# Patient Record
Sex: Female | Born: 1985 | Race: White | Hispanic: No | Marital: Single | Smoking: Current every day smoker
Health system: Southern US, Community
[De-identification: ages and names within clinical notes are randomized; demographics above are authoritative.]

## PROBLEM LIST (undated history)

## (undated) DIAGNOSIS — F410 Panic disorder [episodic paroxysmal anxiety] without agoraphobia: Secondary | ICD-10-CM

## (undated) DIAGNOSIS — E119 Type 2 diabetes mellitus without complications: Secondary | ICD-10-CM

## (undated) DIAGNOSIS — F419 Anxiety disorder, unspecified: Secondary | ICD-10-CM

## (undated) HISTORY — PX: TUBAL LIGATION: SHX77

## (undated) HISTORY — PX: OTHER SURGICAL HISTORY: SHX169

---

## 2005-09-12 ENCOUNTER — Emergency Department: Payer: Self-pay | Admitting: Emergency Medicine

## 2005-09-24 ENCOUNTER — Other Ambulatory Visit: Payer: Self-pay

## 2005-09-24 ENCOUNTER — Emergency Department: Payer: Self-pay | Admitting: Emergency Medicine

## 2007-07-15 ENCOUNTER — Emergency Department: Payer: Self-pay | Admitting: Emergency Medicine

## 2007-12-19 ENCOUNTER — Emergency Department: Payer: Self-pay | Admitting: Emergency Medicine

## 2010-04-24 ENCOUNTER — Emergency Department: Payer: Self-pay | Admitting: Emergency Medicine

## 2010-09-13 ENCOUNTER — Emergency Department: Payer: Self-pay | Admitting: Emergency Medicine

## 2011-12-01 ENCOUNTER — Emergency Department: Payer: Self-pay | Admitting: *Deleted

## 2011-12-01 LAB — CBC
HCT: 42.7 % (ref 35.0–47.0)
MCHC: 34 g/dL (ref 32.0–36.0)
MCV: 88 fL (ref 80–100)
Platelet: 248 10*3/uL (ref 150–440)
RBC: 4.88 10*6/uL (ref 3.80–5.20)
RDW: 13.5 % (ref 11.5–14.5)
WBC: 8.3 10*3/uL (ref 3.6–11.0)

## 2011-12-01 LAB — LIPASE, BLOOD: Lipase: 68 U/L — ABNORMAL LOW (ref 73–393)

## 2011-12-01 LAB — COMPREHENSIVE METABOLIC PANEL
Anion Gap: 7 (ref 7–16)
BUN: 10 mg/dL (ref 7–18)
Calcium, Total: 9.1 mg/dL (ref 8.5–10.1)
Chloride: 107 mmol/L (ref 98–107)
Co2: 27 mmol/L (ref 21–32)
EGFR (African American): >60
Osmolality: 280 (ref 275–301)
SGOT(AST): 21 U/L (ref 15–37)
SGPT (ALT): 35 U/L (ref 12–78)
Total Protein: 7.2 g/dL (ref 6.4–8.2)

## 2011-12-01 LAB — URINALYSIS, COMPLETE
Bacteria: NONE SEEN
Bilirubin,UR: NEGATIVE
Blood: NEGATIVE
Glucose,UR: NEGATIVE mg/dL (ref 0–75)
Specific Gravity: 1.019 (ref 1.003–1.030)
Squamous Epithelial: 1

## 2011-12-02 LAB — LITHIUM LEVEL: Lithium: 0.21 mmol/L — ABNORMAL LOW

## 2013-05-01 IMAGING — CT CT ABD-PELV W/ CM
1 of 2 series · 15 of 32 positions shown, 19 images · non-contrast
Comparison: none

REASON FOR EXAM: (1) trauma; (2) trauma
COMMENTS:

PROCEDURE:     CT  - CT ABDOMEN / PELVIS  W  - December 02, 2011 [DATE]
RESULT:     Comparison is made to a prior study dated 12/19/2007.
TECHNIQUE: Helical 3 mm sections were obtained from the lung bases through
the pubic symphysis status post intravenous administration of 100 mL of
Dsovue-O8M.

[Series 2: 3mm soft tissue · axial · 0.93mm/px · z∈[-1024,-595]mm · 15 of 157 slices shown, 19 images]
[im 7/157  soft-tissue]
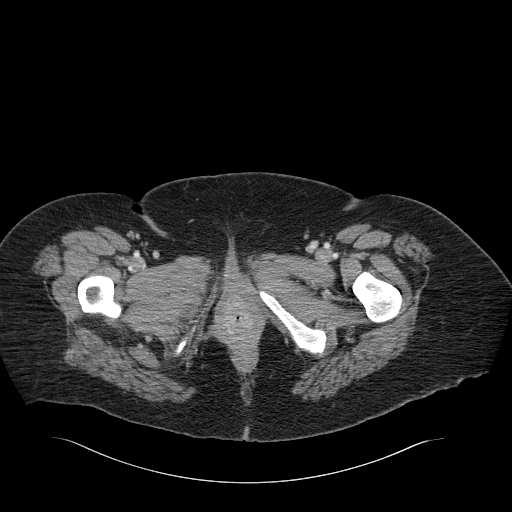
[im 7/157  bone]
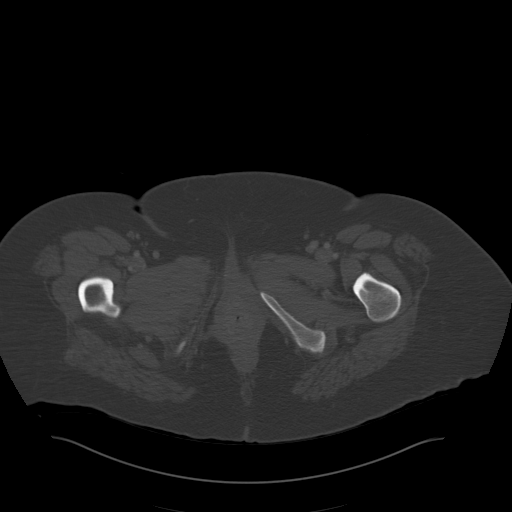
[im 20/157  soft-tissue]
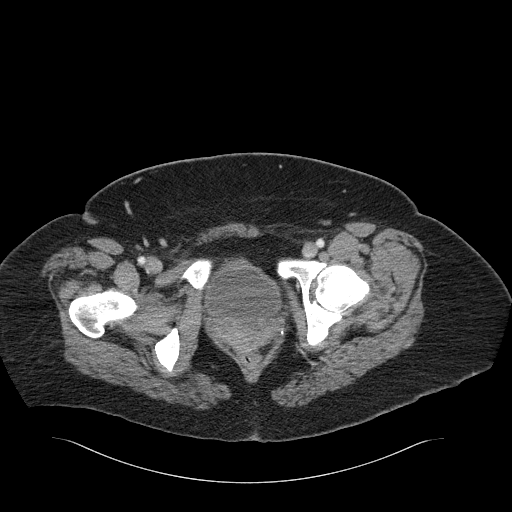
[im 33/157  soft-tissue]
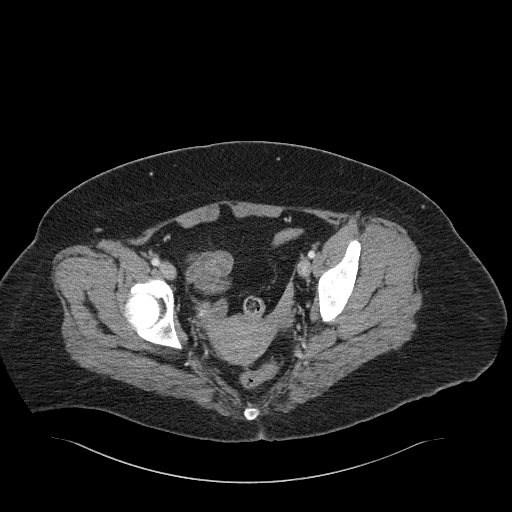
[im 46/157  soft-tissue]
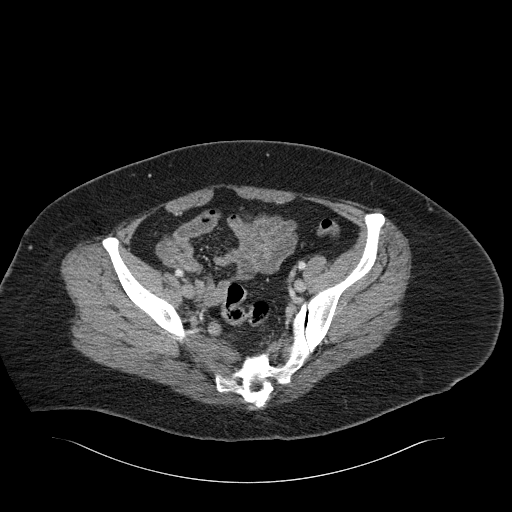
[im 53/157  soft-tissue]
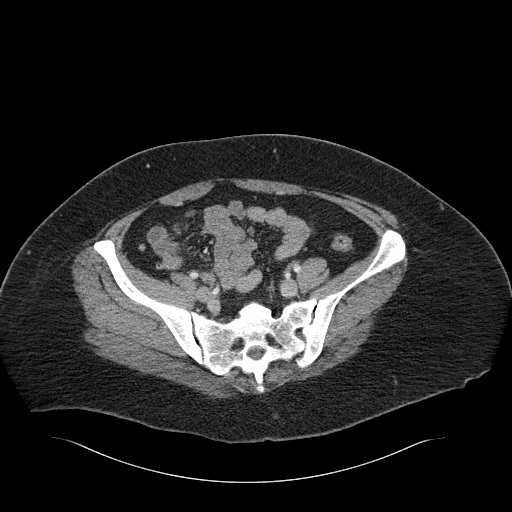
[im 66/157  soft-tissue]
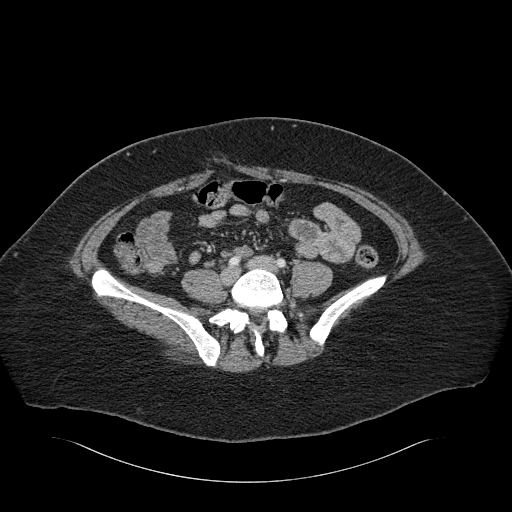
[im 79/157  soft-tissue]
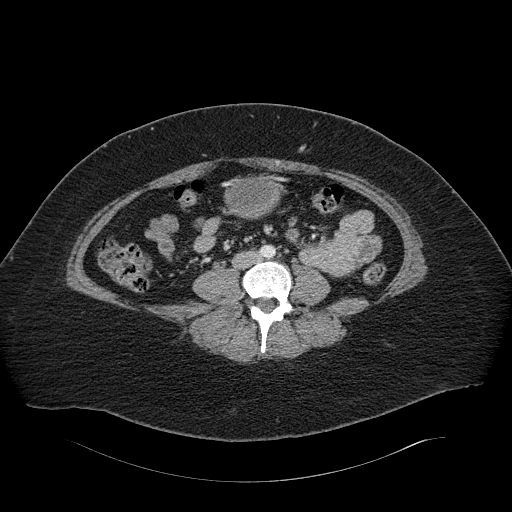
[im 92/157  soft-tissue]
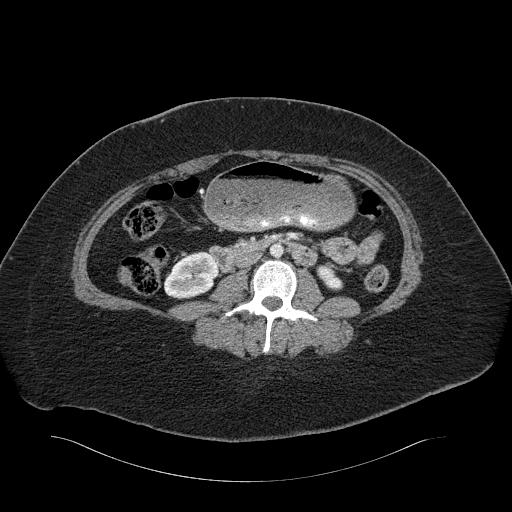
[im 105/157  soft-tissue]
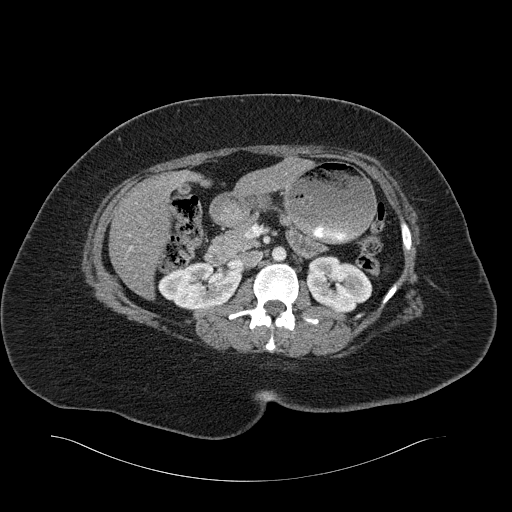
[im 105/157  bone]
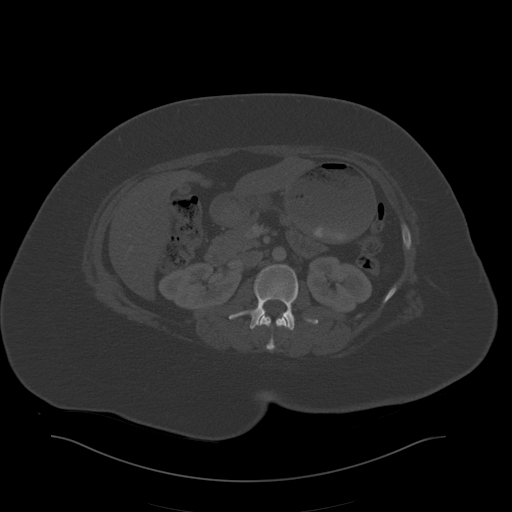
[im 111/157  soft-tissue]
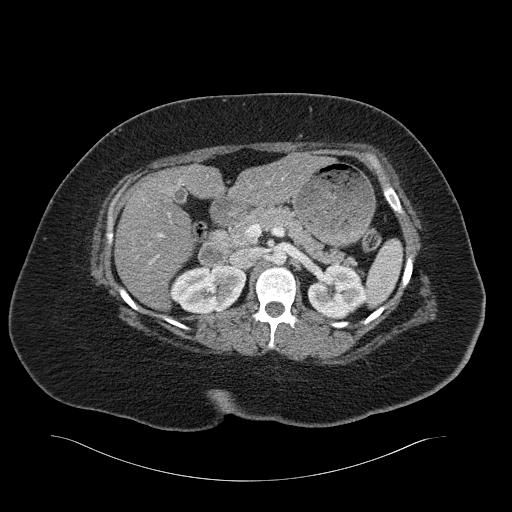
[im 124/157  soft-tissue]
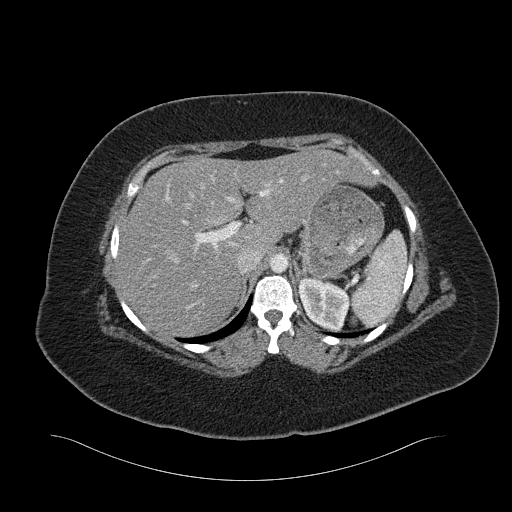
[im 131/157  lung]
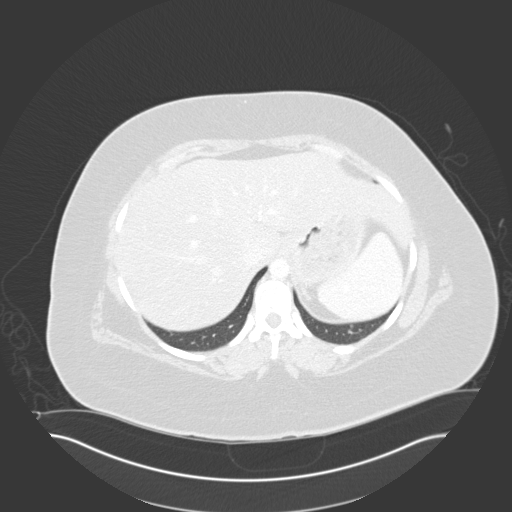
[im 137/157  soft-tissue]
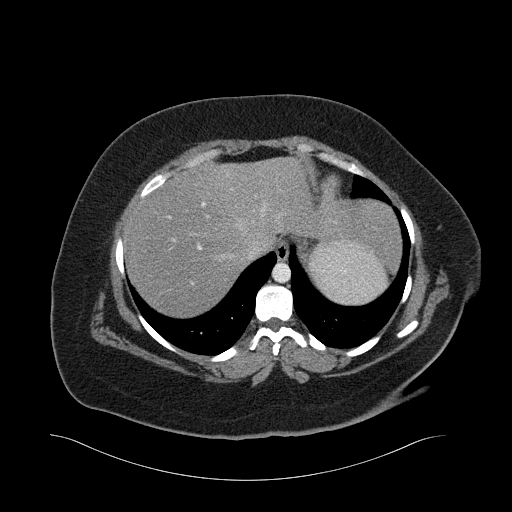
[im 137/157  lung]
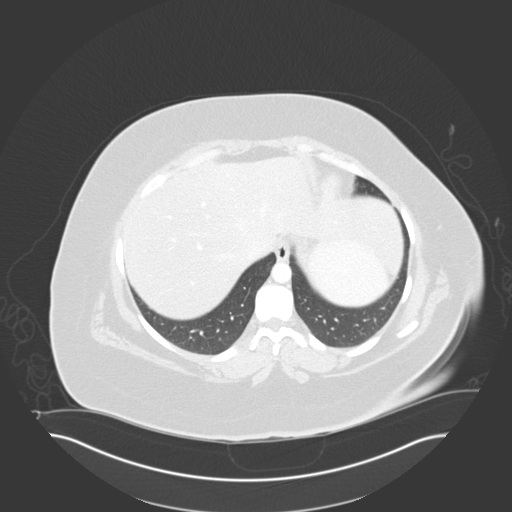
[im 144/157  lung]
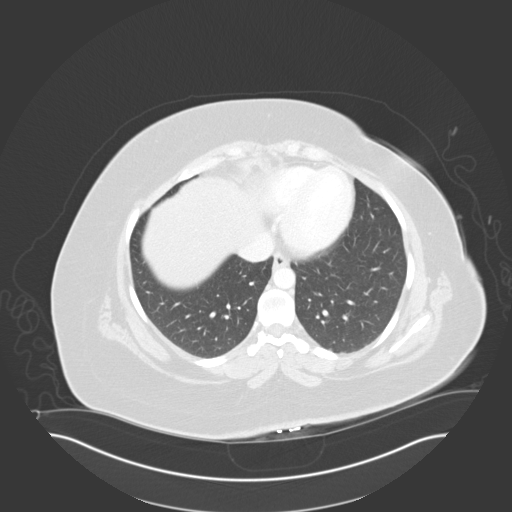
[im 150/157  soft-tissue]
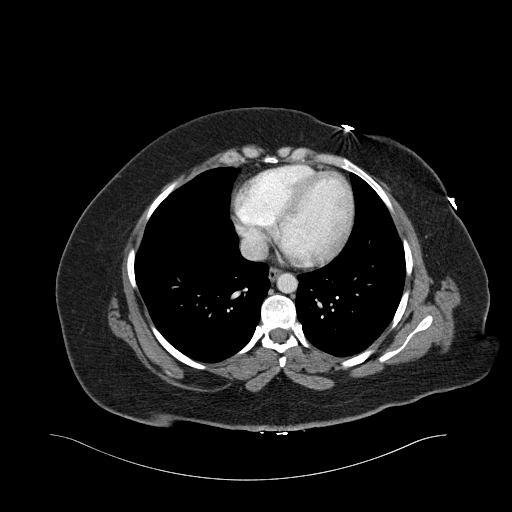
[im 150/157  lung]
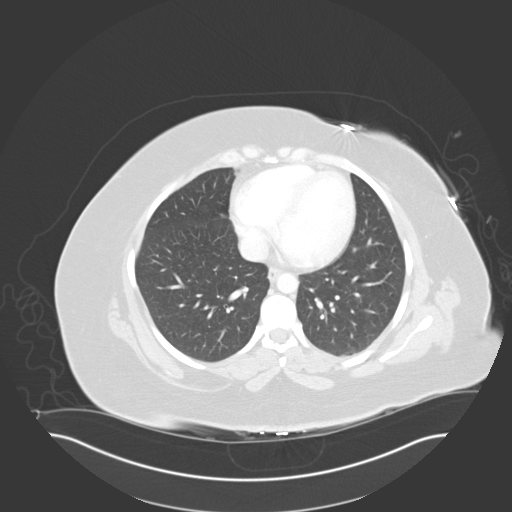

[15 of 32 positions shown; findings below may reference images not displayed]

FINDINGS: There is no evidence of solid or hollow organ injury appreciated.
The liver demonstrates diffuse low-attenuating architecture. Trace amount of
fluid is identified within the deep pelvis possibly physiologic. There is no
evidence of a hematoma. There is no evidence of pneumoperitoneum. Surgical
clips are identified within the right adnexal region also appreciated within
the cul-de-sac. There is no evidence of abdominal aortic aneurysm or
dissection. There is no CT evidence of fracture. There are findings which
may represent a broad-based disc bulge at L5-S1 level with possible mass
effect upon the thecal sac.

The lung bases are unremarkable.
IMPRESSION: 1. No definite acute traumatic injury appreciated within the abdomen or
pelvis.
2. Additional findings as described above.
3. Dr. Jharol of the Emergency Department was informed of these findings via
a preliminary faxed report.

## 2014-02-24 ENCOUNTER — Emergency Department: Payer: Self-pay | Admitting: Emergency Medicine

## 2014-12-15 ENCOUNTER — Encounter: Payer: Self-pay | Admitting: Emergency Medicine

## 2014-12-15 ENCOUNTER — Emergency Department
Admission: EM | Admit: 2014-12-15 | Discharge: 2014-12-15 | Discharge status: Against Medical Advice | Payer: Medicaid Other | Attending: Internal Medicine | Admitting: Internal Medicine

## 2014-12-15 DIAGNOSIS — R1031 Right lower quadrant pain: Secondary | ICD-10-CM | POA: Diagnosis present

## 2014-12-15 DIAGNOSIS — R110 Nausea: Secondary | ICD-10-CM | POA: Insufficient documentation

## 2014-12-15 DIAGNOSIS — Z87891 Personal history of nicotine dependence: Secondary | ICD-10-CM | POA: Insufficient documentation

## 2014-12-15 LAB — URINALYSIS COMPLETE WITH MICROSCOPIC (ARMC ONLY)
Bilirubin Urine: NEGATIVE
GLUCOSE, UA: NEGATIVE mg/dL
NITRITE: NEGATIVE
Protein, ur: 30 mg/dL — AB
SPECIFIC GRAVITY, URINE: 1.031 — AB (ref 1.005–1.030)
pH: 5 (ref 5.0–8.0)

## 2014-12-15 LAB — COMPREHENSIVE METABOLIC PANEL
ALT: 42 U/L (ref 14–54)
AST: 25 U/L (ref 15–41)
Albumin: 3.5 g/dL (ref 3.5–5.0)
Alkaline Phosphatase: 116 U/L (ref 38–126)
Anion gap: 7 (ref 5–15)
BILIRUBIN TOTAL: 0.5 mg/dL (ref 0.3–1.2)
BUN: 11 mg/dL (ref 6–20)
CO2: 25 mmol/L (ref 22–32)
CREATININE: 0.63 mg/dL (ref 0.44–1.00)
Calcium: 9.1 mg/dL (ref 8.9–10.3)
Chloride: 109 mmol/L (ref 101–111)
GFR calc Af Amer: >60 mL/min (ref >60)
Glucose, Bld: 115 mg/dL — ABNORMAL HIGH (ref 65–99)
Potassium: 3 mmol/L — ABNORMAL LOW (ref 3.5–5.1)
Sodium: 141 mmol/L (ref 135–145)
TOTAL PROTEIN: 6.6 g/dL (ref 6.5–8.1)

## 2014-12-15 LAB — CBC
HCT: 43.3 % (ref 35.0–47.0)
Hemoglobin: 14.5 g/dL (ref 12.0–16.0)
MCH: 30.1 pg (ref 26.0–34.0)
MCHC: 33.4 g/dL (ref 32.0–36.0)
MCV: 90.1 fL (ref 80.0–100.0)
PLATELETS: 245 10*3/uL (ref 150–440)
RBC: 4.81 MIL/uL (ref 3.80–5.20)
RDW: 14 % (ref 11.5–14.5)
WBC: 12.1 10*3/uL — AB (ref 3.6–11.0)

## 2014-12-15 LAB — LIPASE, BLOOD: Lipase: 17 U/L — ABNORMAL LOW (ref 22–51)

## 2014-12-15 LAB — ABO/RH: ABO/RH(D): O POS

## 2014-12-15 LAB — HCG, QUANTITATIVE, PREGNANCY: hCG, Beta Chain, Quant, S: <1 m[IU]/mL (ref <5)

## 2014-12-15 NOTE — ED Notes (Addendum)
Pt reports RLQ abd pain that started 3-4 days ago. Denies vomiting or diarrhea. Endorses nausea. Denies fever. Pt also states she is concerned that she has had a miscarriage because she feel off a latter  Tuesday and began bleeding there after. Pt states she does not know how far along she was at the time

## 2014-12-17 ENCOUNTER — Telehealth: Payer: Self-pay | Admitting: Emergency Medicine

## 2014-12-17 NOTE — ED Notes (Signed)
Called patient due to lwot to inquire about condition and follow up plans.  No answer and no voicemail  

## 2015-10-06 ENCOUNTER — Emergency Department
Admission: EM | Admit: 2015-10-06 | Discharge: 2015-10-06 | Disposition: A | Discharge status: Home / Self Care | Payer: Medicaid Other | Attending: Emergency Medicine | Admitting: Emergency Medicine

## 2015-10-06 ENCOUNTER — Encounter: Payer: Self-pay | Admitting: Emergency Medicine

## 2015-10-06 DIAGNOSIS — F17200 Nicotine dependence, unspecified, uncomplicated: Secondary | ICD-10-CM | POA: Insufficient documentation

## 2015-10-06 DIAGNOSIS — K089 Disorder of teeth and supporting structures, unspecified: Secondary | ICD-10-CM | POA: Insufficient documentation

## 2015-10-06 DIAGNOSIS — K122 Cellulitis and abscess of mouth: Secondary | ICD-10-CM | POA: Insufficient documentation

## 2015-10-06 MED ORDER — SULFAMETHOXAZOLE-TRIMETHOPRIM 800-160 MG PO TABS: 1.0000 | ORAL_TABLET | Freq: Two times a day (BID) | ORAL | Status: AC

## 2015-10-06 MED ORDER — PREDNISONE 20 MG PO TABS: ORAL_TABLET | ORAL | Status: AC

## 2015-10-06 NOTE — Discharge Instructions (Signed)

## 2015-10-06 NOTE — ED Provider Notes (Signed)
[facility] Emergency Department Provider Note  ____________________________________________  Time seen: Approximately 4:19 PM  I have reviewed the triage vital signs and the nursing notes.   HISTORY  Chief Complaint Facial Swelling    HPI Sheila Mcknight is a 30 y.o. female , NAD, presents to the emergency part with 2 hour history of right cheek swelling. States she woke approximately 2 hours ago with significant right cheek swelling and a sensation of a "rock" being in her cheek. Denies any exposures to known allergens. Has no environmental allergies. Took Benadryl when she noted the swelling which has not helped. Has not had any difficulty breathing, swallowing nor had any wheezing or shortness of breath. Denies swelling about the tongue, throat, lips. Does know she has poor dentition and is scheduled to have teeth removed over the next 1-2 months. Denies any dental pain. Has not had any fevers, chills, body aches. No abdominal pain, nausea, vomiting.   History reviewed. No pertinent past medical history.  There are no active problems to display for this patient.   Past Surgical History  Procedure Laterality Date  . Tubal ligation      Current Outpatient Rx  Name  Route  Sig  Dispense  Refill  . predniSONE (DELTASONE) 20 MG tablet      Take 2 tablets by mouth, once daily, for 5 days   10 tablet   0   . sulfamethoxazole-trimethoprim (BACTRIM DS,SEPTRA DS) 800-160 MG tablet   Oral   Take 1 tablet by mouth 2 (two) times daily.   14 tablet   0     Allergies Contrast media and Penicillins  No family history on file.  Social History Social History  Substance Use Topics  . Smoking status: Current Every Day Smoker -- 0.50 packs/day  . Smokeless tobacco: Never Used  . Alcohol Use: No     Review of Systems  Constitutional: No fever/chills Head: Swelling about right cheek. Eyes: No visual changes. No discharge ENT: Positive poor  dentition. No dental pain, sore throat, swelling about throat/lips/tongue, difficulty swallowing, dry mouth, ear pain. Cardiovascular: No chest pain. Respiratory: No cough. No shortness of breath. No wheezing.  Gastrointestinal: No abdominal pain.  No nausea, vomiting.   Musculoskeletal: No jaw or neck pain.  Skin: Negative for rash, redness, warmth, open wounds or lacerations. Neurological: Negative for headaches, focal weakness or numbness. No tingling 10-point ROS otherwise negative.  ____________________________________________   PHYSICAL EXAM:  VITAL SIGNS: ED Triage Vitals  Enc Vitals Group     BP 10/06/15 1559 142/91 mmHg     Pulse Rate 10/06/15 1559 88     Resp 10/06/15 1559 20     Temp 10/06/15 1559 98.8 F (37.1 C)     Temp Source 10/06/15 1559 Oral     SpO2 10/06/15 1559 97 %     Weight 10/06/15 1559 260 lb (117.935 kg)     Height 10/06/15 1559 5\' 7"  (1.702 m)     Head Cir --      Peak Flow --      Pain Score --      Pain Loc --      Pain Edu? --      Excl. in GC? --      Constitutional: Alert and oriented. Well appearing and in no acute distress. Eyes: Conjunctivae are normal.  Head: Atraumatic. ENT:      Ears: TMs visualized bilaterally without erythema, effusion, bulging, perforation.  Nose: No congestion/rhinnorhea.      Mouth/Throat: Poor dentition with multiple cavities, broken teeth and discoloration throughout the mouth. No tenderness to palpation about the right upper or lower gumline but mild erythema and swelling is noted about the posterior gumline. Mucous membranes are moist. Uvula is midline. Pharynx without erythema, swelling, exudate Neck: No stridor. Supple with full range of motion. Hematological/Lymphatic/Immunilogical: No cervical lymphadenopathy. Cardiovascular:  Good peripheral circulation. Respiratory: Normal respiratory effort without tachypnea or retractions.  Musculoskeletal: No tenderness to palpation about bilateral TMJ. No  tenderness to palpation about bilateral mandibles. Neurologic:  Normal speech and language. No gross focal neurologic deficits are appreciated. Gait and posture are normal Skin:  Skin is warm, dry and intact. No rash, redness, open wounds, skin sores noted. Psychiatric: Mood and affect are normal. Speech and behavior are normal. Patient exhibits appropriate insight and judgement.   ____________________________________________   LABS  None ____________________________________________  EKG  None ____________________________________________  RADIOLOGY  None ____________________________________________    PROCEDURES  Procedure(s) performed: None    Medications - No data to display   ____________________________________________   INITIAL IMPRESSION / ASSESSMENT AND PLAN / ED COURSE  Patient's diagnosis is consistent with cellulitis of oral soft tissues due to poor dentition. Patient will be discharged home with prescriptions for prednisone and Bactrim DS to take as directed. Patient may take Tylenol as needed for any pain. Patient is to follow up with [facility] if symptoms persist past this treatment course. Patient also advised to continue dental care plan as she has outlined with her dentist over the next 1-2 months. Patient is given ED precautions to return to the ED for any worsening or new symptoms.     ____________________________________________  FINAL CLINICAL IMPRESSION(S) / ED DIAGNOSES  Final diagnoses:  Cellulitis of oral soft tissues  Poor dentition      NEW MEDICATIONS STARTED DURING THIS VISIT:  New Prescriptions   PREDNISONE (DELTASONE) 20 MG TABLET    Take 2 tablets by mouth, once daily, for 5 days   SULFAMETHOXAZOLE-TRIMETHOPRIM (BACTRIM DS,SEPTRA DS) 800-160 MG TABLET    Take 1 tablet by mouth 2 (two) times daily.         Hope Pigeon, PA-C 10/06/15 1629  Emily Filbert, MD 10/06/15 [phone]

## 2015-10-06 NOTE — ED Notes (Signed)
Pt comes into the ED via POV c/o swelling to the right cheek.  Patient states she woke up from a nap earlier today with it swollen.  H/o of dental abscesses, denies any allergic reaction to any known substances she has come in contact with, denies any pain, denies difficulty swallowing and breathing.  Patient in NAD at this time with even and unlabored respirations.

## 2017-07-19 ENCOUNTER — Encounter: Payer: Self-pay | Admitting: Emergency Medicine

## 2017-07-19 ENCOUNTER — Other Ambulatory Visit: Payer: Self-pay

## 2017-07-19 DIAGNOSIS — Z79899 Other long term (current) drug therapy: Secondary | ICD-10-CM | POA: Diagnosis not present

## 2017-07-19 DIAGNOSIS — F17210 Nicotine dependence, cigarettes, uncomplicated: Secondary | ICD-10-CM | POA: Diagnosis not present

## 2017-07-19 DIAGNOSIS — O9989 Other specified diseases and conditions complicating pregnancy, childbirth and the puerperium: Secondary | ICD-10-CM | POA: Insufficient documentation

## 2017-07-19 DIAGNOSIS — Z3A22 22 weeks gestation of pregnancy: Secondary | ICD-10-CM | POA: Insufficient documentation

## 2017-07-19 DIAGNOSIS — Z3201 Encounter for pregnancy test, result positive: Secondary | ICD-10-CM | POA: Insufficient documentation

## 2017-07-19 DIAGNOSIS — R55 Syncope and collapse: Secondary | ICD-10-CM | POA: Diagnosis not present

## 2017-07-19 DIAGNOSIS — O99332 Smoking (tobacco) complicating pregnancy, second trimester: Secondary | ICD-10-CM | POA: Insufficient documentation

## 2017-07-19 LAB — CBC
HCT: 38.8 % (ref 35.0–47.0)
HEMOGLOBIN: 13.3 g/dL (ref 12.0–16.0)
MCH: 29.5 pg (ref 26.0–34.0)
MCHC: 34.3 g/dL (ref 32.0–36.0)
MCV: 86.2 fL (ref 80.0–100.0)
PLATELETS: 307 10*3/uL (ref 150–440)
RBC: 4.5 MIL/uL (ref 3.80–5.20)
RDW: 13.7 % (ref 11.5–14.5)
WBC: 14.3 10*3/uL — AB (ref 3.6–11.0)

## 2017-07-19 LAB — URINALYSIS, COMPLETE (UACMP) WITH MICROSCOPIC
BILIRUBIN URINE: NEGATIVE
GLUCOSE, UA: NEGATIVE mg/dL
HGB URINE DIPSTICK: NEGATIVE
KETONES UR: NEGATIVE mg/dL
LEUKOCYTES UA: NEGATIVE
NITRITE: NEGATIVE
PROTEIN: NEGATIVE mg/dL
Specific Gravity, Urine: 1.005 (ref 1.005–1.030)
pH: 6 (ref 5.0–8.0)

## 2017-07-19 LAB — BASIC METABOLIC PANEL
ANION GAP: 9 (ref 5–15)
BUN: 7 mg/dL (ref 6–20)
CALCIUM: 9.1 mg/dL (ref 8.9–10.3)
CO2: 22 mmol/L (ref 22–32)
CREATININE: 0.52 mg/dL (ref 0.44–1.00)
Chloride: 102 mmol/L (ref 101–111)
Glucose, Bld: 157 mg/dL — ABNORMAL HIGH (ref 65–99)
Potassium: 3.3 mmol/L — ABNORMAL LOW (ref 3.5–5.1)
SODIUM: 133 mmol/L — AB (ref 135–145)

## 2017-07-19 NOTE — ED Triage Notes (Signed)
Pt arrived to the ED accompanied by a friend for complaints of "passing out." Pt states that she was walking to her living room when she passed out, Pt denies hitting her head but it was an unwitnessed fall. Pt reports that this has happened before when she has been pregnant in the past. Pt reports that she can not be pregnant because she got a tubal ligation. Pt is AOx4 in no apparent distress with no neurological deficiencies and denies any other symptoms.

## 2017-07-20 ENCOUNTER — Emergency Department
Admission: EM | Admit: 2017-07-20 | Discharge: 2017-07-20 | Disposition: A | Discharge status: Home / Self Care | Payer: Medicaid Other | Attending: Emergency Medicine | Admitting: Emergency Medicine

## 2017-07-20 ENCOUNTER — Emergency Department: Payer: Medicaid Other

## 2017-07-20 ENCOUNTER — Encounter: Payer: Self-pay | Admitting: Radiology

## 2017-07-20 DIAGNOSIS — Z3A22 22 weeks gestation of pregnancy: Secondary | ICD-10-CM

## 2017-07-20 DIAGNOSIS — R55 Syncope and collapse: Secondary | ICD-10-CM

## 2017-07-20 LAB — PREGNANCY, URINE: PREG TEST UR: POSITIVE — AB

## 2017-07-20 LAB — TROPONIN I: Troponin I: <0.03 ng/mL (ref <0.03)

## 2017-07-20 LAB — HCG, QUANTITATIVE, PREGNANCY: HCG, BETA CHAIN, QUANT, S: 15751 m[IU]/mL — AB (ref <5)

## 2017-07-20 NOTE — ED Provider Notes (Signed)
[facility] Emergency Department Provider Note   ____________________________________________   First MD Initiated Contact with Patient 07/20/17 0019     (approximate)  I have reviewed the triage vital signs and the nursing notes.   HISTORY  Chief Complaint Loss of Consciousness    HPI Sheila Mcknight is a 32 y.o. female who comes into the hospital today with a syncopal episode.  The patient states that around 7 PM she passed out.  She reports that she had been feeling completely fine earlier today and was just walking through her living room when she went down.  The patient had no symptoms prior to her passing out.  She denies any tunnel vision any chest pains any palpitations or anything else.  The patient states that she has passed out before but it was only when she was pregnant.  The patient has been eating and drinking okay.  She has had her tubes tied in the past.  The patient denies any complaints and has been urinating well without any pain.  The patient is here today for evaluation.   History reviewed. No pertinent past medical history.  There are no active problems to display for this patient.   Past Surgical History:  Procedure Laterality Date  . TUBAL LIGATION      Prior to Admission medications   Medication Sig Start Date End Date Taking? Authorizing Provider  predniSONE (DELTASONE) 20 MG tablet Take 2 tablets by mouth, once daily, for 5 days 10/06/15   Hagler, Jami L, PA-C  sulfamethoxazole-trimethoprim (BACTRIM DS,SEPTRA DS) 800-160 MG tablet Take 1 tablet by mouth 2 (two) times daily. 10/06/15   Hagler, Jami L, PA-C    Allergies Contrast media [iodinated diagnostic agents] and Penicillins  History reviewed. No pertinent family history.  Social History Social History   Tobacco Use  . Smoking status: Current Every Day Smoker    Packs/day: 0.50  . Smokeless tobacco: Never Used  Substance Use Topics  . Alcohol use: No  . Drug  use: No    Review of Systems  Constitutional: No fever/chills Eyes: No visual changes. ENT: No sore throat. Cardiovascular: Denies chest pain. Respiratory: Denies shortness of breath. Gastrointestinal: No abdominal pain.  No nausea, no vomiting.  No diarrhea.  No constipation. Genitourinary: Negative for dysuria. Musculoskeletal: Negative for back pain. Skin: Negative for rash. Neurological: Syncope   ____________________________________________   PHYSICAL EXAM:  VITAL SIGNS: ED Triage Vitals  Enc Vitals Group     BP 07/19/17 2051 (!) 148/89     Pulse Rate 07/19/17 2051 (!) 123     Resp 07/19/17 2051 18     Temp 07/19/17 2051 98.8 F (37.1 C)     Temp Source 07/19/17 2051 Oral     SpO2 07/19/17 2051 98 %     Weight 07/19/17 2052 280 lb (127 kg)     Height 07/19/17 2052 5\' 7"  (1.702 m)     Head Circumference --      Peak Flow --      Pain Score 07/19/17 2102 0     Pain Loc --      Pain Edu? --      Excl. in GC? --     Constitutional: Alert and oriented. Well appearing and in no acute distress. Eyes: Conjunctivae are normal. PERRL. EOMI. Head: Atraumatic. Nose: No congestion/rhinnorhea. Mouth/Throat: Mucous membranes are moist.  Oropharynx non-erythematous. Cardiovascular: Normal rate, regular rhythm. Grossly normal heart sounds.  Good peripheral circulation. Respiratory: Normal  respiratory effort.  No retractions. Lungs CTAB. Gastrointestinal: Soft and nontender. No distention.  Positive bowel sounds Musculoskeletal: No lower extremity tenderness nor edema.  Neurologic:  Normal speech and language.  Cranial nerves II through XII are grossly intact with no focal motor neuro deficit Skin:  Skin is warm, dry and intact.  Psychiatric: Mood and affect are normal.   ____________________________________________   LABS (all labs ordered are listed, but only abnormal results are displayed)  Labs Reviewed  BASIC METABOLIC PANEL - Abnormal; Notable for the following  components:      Result Value   Sodium 133 (*)    Potassium 3.3 (*)    Glucose, Bld 157 (*)    All other components within normal limits  CBC - Abnormal; Notable for the following components:   WBC 14.3 (*)    All other components within normal limits  URINALYSIS, COMPLETE (UACMP) WITH MICROSCOPIC - Abnormal; Notable for the following components:   Color, Urine YELLOW (*)    APPearance CLEAR (*)    Bacteria, UA MANY (*)    Squamous Epithelial / LPF 0-5 (*)    All other components within normal limits  PREGNANCY, URINE - Abnormal; Notable for the following components:   Preg Test, Ur POSITIVE (*)    All other components within normal limits  HCG, QUANTITATIVE, PREGNANCY - Abnormal; Notable for the following components:   hCG, Beta Chain, Quant, S 15,751 (*)    All other components within normal limits  URINE CULTURE  TROPONIN I  CBG MONITORING, ED  POC URINE PREG, ED   ____________________________________________  EKG  ED ECG REPORT I, Rebecka Apley, the attending physician, personally viewed and interpreted this ECG.   Date: 07/19/2017  EKG Time: 2100  Rate: 123  Rhythm: sinus tachycardia  Axis: normal  Intervals:none  ST&T Change: none  ____________________________________________  RADIOLOGY  ED MD interpretation:  US Ob: Viable 22-week 4-day gestation with subjectively normal amniotic fluid level.  Official radiology report(s): US Ob Limited  Result Date: 07/20/2017 CLINICAL DATA:  Positive pregnancy test with history of tubal ligation. Syncope. EXAM: LIMITED OBSTETRIC ULTRASOUND FINDINGS: Number of Fetuses: 1 Heart Rate:  160 bpm Movement: Yes Presentation: Cephalic Placental Location: Anterior Previa: No Amniotic Fluid (Subjective):  Within normal limits. BPD: 5.4  cm 22 w  4 d MATERNAL FINDINGS: Cervix:  Not visualized Uterus/Adnexae: Right ovary was unremarkable. The left ovary was not visualized. IMPRESSION: Viable 22 week 4 day gestation with  subjectively normal amniotic fluid volume. No placental abruption or previa. This exam is performed on an emergent basis and does not comprehensively evaluate fetal size, dating, or anatomy; follow-up complete OB US should be considered if further fetal assessment is warranted. Electronically Signed   By: Tollie Eth M.D.   On: 07/20/2017 04:04    ____________________________________________   PROCEDURES  Procedure(s) performed: None  Procedures  Critical Care performed: No  ____________________________________________   INITIAL IMPRESSION / ASSESSMENT AND PLAN / ED COURSE  As part of my medical decision making, I reviewed the following data within the electronic MEDICAL RECORD NUMBER Notes from prior ED visits and [place] Controlled Substance Database   This is a 32 year old female who comes into the hospital today with a syncopal episode.  My differential diagnosis includes dehydration, orthostatic hypotension, cardiogenic cause of syncope, neurogenic cause of syncope, infection.  The patient is neurologically intact.  We did check some blood work on the patient to include a CBC CMP urinalysis pregnancy and a  troponin.  The patient CBC shows a white count of 14.3 but her BMP is unremarkable.  The patient's urine shows many bacteria.  At this time we are awaiting the results of the troponin and the urine pregnancy.  I will have the patient orally hydrate as she states she does not want to be stuck again and she will be reassessed.  We did obtain the results of the patient's urinalysis and her pregnancy test was positive.  At this time we are concerned about a possible ectopic pregnancy given the patient's history of tubal ligation.  The patient was sent for an ultrasound to evaluate for ectopic pregnancy and was found to be 22 weeks and 4 days pregnant.  The fetal heart tones are 160. The patient will be discharged to home to follow-up with OB/GYN.       ____________________________________________   FINAL CLINICAL IMPRESSION(S) / ED DIAGNOSES  Final diagnoses:  Syncope, unspecified syncope type  [redacted] weeks gestation of pregnancy     ED Discharge Orders    None       Note:  This document was prepared using Dragon voice recognition software and may include unintentional dictation errors.    Rebecka ApleyWebster, Aditya Nastasi P, MD 07/20/17 [phone]

## 2017-07-20 NOTE — Discharge Instructions (Addendum)
Please follow up with OB/GYN for further evaluation of your pregnancy

## 2017-07-20 NOTE — ED Notes (Signed)
Patient transported to Ultrasound 

## 2017-07-21 LAB — URINE CULTURE

## 2019-10-29 ENCOUNTER — Encounter: Payer: Self-pay | Admitting: Emergency Medicine

## 2019-10-29 ENCOUNTER — Other Ambulatory Visit: Payer: Self-pay

## 2019-10-29 ENCOUNTER — Emergency Department
Admission: EM | Admit: 2019-10-29 | Discharge: 2019-10-30 | Discharge status: Against Medical Advice | Payer: Medicaid Other | Attending: Emergency Medicine | Admitting: Emergency Medicine

## 2019-10-29 DIAGNOSIS — Z3200 Encounter for pregnancy test, result unknown: Secondary | ICD-10-CM | POA: Insufficient documentation

## 2019-10-29 DIAGNOSIS — R55 Syncope and collapse: Secondary | ICD-10-CM

## 2019-10-29 LAB — URINALYSIS, COMPLETE (UACMP) WITH MICROSCOPIC
Bilirubin Urine: NEGATIVE
Glucose, UA: >=500 mg/dL — AB
Hgb urine dipstick: NEGATIVE
Ketones, ur: 5 mg/dL — AB
Leukocytes,Ua: NEGATIVE
Nitrite: NEGATIVE
Protein, ur: 100 mg/dL — AB
Specific Gravity, Urine: 1.041 — ABNORMAL HIGH (ref 1.005–1.030)
pH: 5 (ref 5.0–8.0)

## 2019-10-29 LAB — BASIC METABOLIC PANEL
Anion gap: 13 (ref 5–15)
BUN: 6 mg/dL (ref 6–20)
CO2: 27 mmol/L (ref 22–32)
Calcium: 8.9 mg/dL (ref 8.9–10.3)
Chloride: 98 mmol/L (ref 98–111)
Creatinine, Ser: 0.6 mg/dL (ref 0.44–1.00)
GFR calc Af Amer: >60 mL/min (ref >60)
GFR calc non Af Amer: >60 mL/min (ref >60)
Glucose, Bld: 335 mg/dL — ABNORMAL HIGH (ref 70–99)
Potassium: 3 mmol/L — ABNORMAL LOW (ref 3.5–5.1)
Sodium: 138 mmol/L (ref 135–145)

## 2019-10-29 LAB — CBC
HCT: 48.1 % — ABNORMAL HIGH (ref 36.0–46.0)
Hemoglobin: 16.5 g/dL — ABNORMAL HIGH (ref 12.0–15.0)
MCH: 30.6 pg (ref 26.0–34.0)
MCHC: 34.3 g/dL (ref 30.0–36.0)
MCV: 89.2 fL (ref 80.0–100.0)
Platelets: 250 10*3/uL (ref 150–400)
RBC: 5.39 MIL/uL — ABNORMAL HIGH (ref 3.87–5.11)
RDW: 12.7 % (ref 11.5–15.5)
WBC: 10.3 10*3/uL (ref 4.0–10.5)
nRBC: 0 % (ref 0.0–0.2)

## 2019-10-29 LAB — HCG, QUANTITATIVE, PREGNANCY: hCG, Beta Chain, Quant, S: 1 m[IU]/mL (ref <5)

## 2019-10-29 LAB — POCT PREGNANCY, URINE: Preg Test, Ur: NEGATIVE

## 2019-10-29 NOTE — ED Provider Notes (Signed)
Went to evaluate patient to relay results of her hCG test. Patient not currently in room. Nurse reports she stepped outside, will wait to see if she returns   Sharyn Creamer, MD 10/29/19 2353

## 2019-10-29 NOTE — ED Provider Notes (Signed)
[facility] Emergency Department Provider Note   ____________________________________________    I have reviewed the triage vital signs and the nursing notes.   HISTORY  Chief Complaint pregnant and Loss of Consciousness     HPI Sheila Mcknight is a 34 y.o. female who presents after near syncopal episode.  Patient reports that around 1 PM today she had been standing for a few minutes and became dizzy and apparently had a syncopal episode.  She reports she was only down for less than 1 minute.  She denies palpitations or chest pain.  No nausea or vomiting.  No headache or neuro deficits.  She also reports that today she checked a pregnancy test and it was positive which is very alarming to her because she has had a tubal ligation x2.  This may have played a role in her near syncopal episode.  History reviewed. No pertinent past medical history.  There are no problems to display for this patient.   Past Surgical History:  Procedure Laterality Date  . removal of fallopian tubes    . TUBAL LIGATION      Prior to Admission medications   Medication Sig Start Date End Date Taking? Authorizing Provider  predniSONE (DELTASONE) 20 MG tablet Take 2 tablets by mouth, once daily, for 5 days 10/06/15   Hagler, Jami L, PA-C  sulfamethoxazole-trimethoprim (BACTRIM DS,SEPTRA DS) 800-160 MG tablet Take 1 tablet by mouth 2 (two) times daily. 10/06/15   Hagler, Jami L, PA-C     Allergies Contrast media [iodinated diagnostic agents], Penicillins, and Latex  History reviewed. No pertinent family history.  Social History Social History   Tobacco Use  . Smoking status: Current Every Day Smoker    Packs/day: 0.50  . Smokeless tobacco: Never Used  Substance Use Topics  . Alcohol use: No  . Drug use: No    Review of Systems  Constitutional: No fever/chills Eyes: No visual changes.  ENT: No sore throat. Cardiovascular: Denies chest pain. Respiratory: Denies  shortness of breath. Gastrointestinal: No abdominal pain. Genitourinary: Negative for dysuria. Musculoskeletal: Negative for back pain. Skin: Negative for rash. Neurological: Negative for headaches or weakness   ____________________________________________   PHYSICAL EXAM:  VITAL SIGNS: ED Triage Vitals [10/29/19 1526]  Enc Vitals Group     BP (!) 147/108     Pulse Rate 100     Resp 20     Temp 98.8 F (37.1 C)     Temp Source Oral     SpO2 96 %     Weight      Height      Head Circumference      Peak Flow      Pain Score      Pain Loc      Pain Edu?      Excl. in GC?     Constitutional: Alert and oriented.   Nose: No congestion/rhinnorhea. Mouth/Throat: Mucous membranes are moist.   Neck:  Painless ROM Cardiovascular: Normal rate, regular rhythm.  Good peripheral circulation. Respiratory: Normal respiratory effort.  No retractions.  Musculoskeletal: No lower extremity tenderness nor edema.  Warm and well perfused Neurologic:  Normal speech and language. No gross focal neurologic deficits are appreciated.  Skin:  Skin is warm, dry and intact. No rash noted. Psychiatric: Mood and affect are normal. Speech and behavior are normal.  ____________________________________________   LABS (all labs ordered are listed, but only abnormal results are displayed)  Labs Reviewed  BASIC METABOLIC  PANEL - Abnormal; Notable for the following components:      Result Value   Potassium 3.0 (*)    Glucose, Bld 335 (*)    All other components within normal limits  CBC - Abnormal; Notable for the following components:   RBC 5.39 (*)    Hemoglobin 16.5 (*)    HCT 48.1 (*)    All other components within normal limits  URINALYSIS, COMPLETE (UACMP) WITH MICROSCOPIC - Abnormal; Notable for the following components:   Color, Urine AMBER (*)    APPearance CLOUDY (*)    Specific Gravity, Urine 1.041 (*)    Glucose, UA >=500 (*)    Ketones, ur 5 (*)    Protein, ur 100 (*)     Bacteria, UA RARE (*)    All other components within normal limits  HCG, QUANTITATIVE, PREGNANCY  POC URINE PREG, ED  POCT PREGNANCY, URINE   ____________________________________________  EKG  ED ECG REPORT I, Jene Every, the attending physician, personally viewed and interpreted this ECG.  Date: 10/29/2019  Rhythm: normal sinus rhythm QRS Axis: normal Intervals: normal ST/T Wave abnormalities: normal Narrative Interpretation: no evidence of acute ischemia  ____________________________________________  RADIOLOGY  None ____________________________________________   PROCEDURES  Procedure(s) performed: No  Procedures   Critical Care performed: No ____________________________________________   INITIAL IMPRESSION / ASSESSMENT AND PLAN / ED COURSE  Pertinent labs & imaging results that were available during my care of the patient were reviewed by me and considered in my medical decision making (see chart for details).  Patient presents after syncopal episode as described above.  Differential includes vasovagal syncope, dehydration, less likely arrhythmia  EKG is quite reassuring, lab work today is unremarkable, mild elevations of glucose but normal BUN to creatinine ratio.  Patient is quite concerned about possible pregnancy, explained that POCT pregnancy here is negative, she has requested blood test.  We will send hCG.  Once hCG is resulted anticipate discharge with outpatient follow-up with PCP.    ____________________________________________   FINAL CLINICAL IMPRESSION(S) / ED DIAGNOSES  Final diagnoses:  Syncope and collapse        Note:  This document was prepared using Dragon voice recognition software and may include unintentional dictation errors.   Jene Every, MD 10/29/19 [phone]

## 2019-10-29 NOTE — ED Triage Notes (Signed)
Pt here for syncope this AM. Reports was standing still and passed out hitting floor.  Also did home pregnancy test and was positive per pt despite tubes being removed.  Ambulatory, NAD, VSS. Alert and oriented.

## 2019-10-29 NOTE — ED Notes (Signed)
Patient to stat desk inquiring about wait time.  Explained to patient that I had called her for a treatment room earlier but we were unable to locate her.  Patient verbalized understanding.

## 2019-10-29 NOTE — ED Notes (Signed)
Pt went outside 

## 2019-10-29 NOTE — ED Notes (Signed)
Pt states she is pregnant, unknown lmp. Pt states she had a syncopal episode this am. Pt states she knows she is pregnant because she faints when pregnant. Pt states has had nausea, vomiting and diarrhea. Pt appears in no acute distress. Skin PWD.

## 2019-10-30 ENCOUNTER — Telehealth: Payer: Self-pay | Admitting: Emergency Medicine

## 2019-10-30 NOTE — Telephone Encounter (Signed)
Called left voicemail on her line (without patient demographic data) that she may call back to ER if she would like to discuss her visit or answer any questions. - patient evidently eloped

## 2019-10-30 NOTE — ED Notes (Signed)
Pt never returned from outside.

## 2020-01-16 IMAGING — US US OB LIMITED
1 series · 14 of 23 positions shown · non-contrast
Comparison: none

CLINICAL DATA: Positive pregnancy test with history of tubal
ligation. Syncope.

EXAM:
LIMITED OBSTETRIC ULTRASOUND

[Series 1: us ob limited · 0.28mm/px · 14 of 23 slices shown]
[im 1/23]
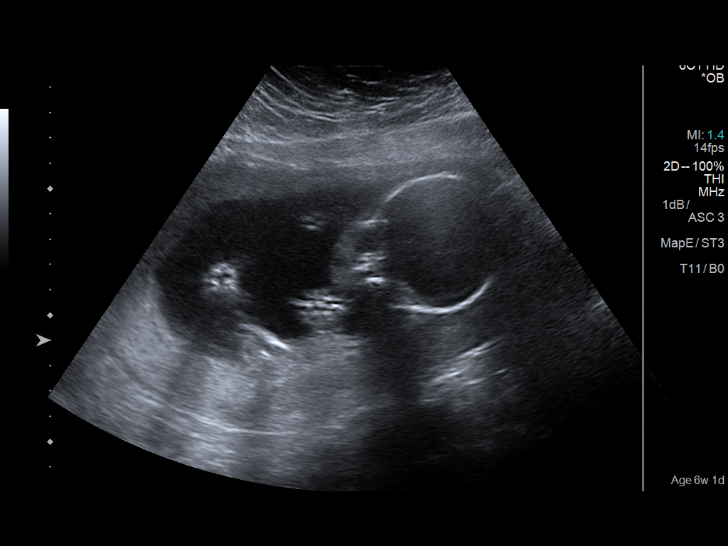
[im 3/23]
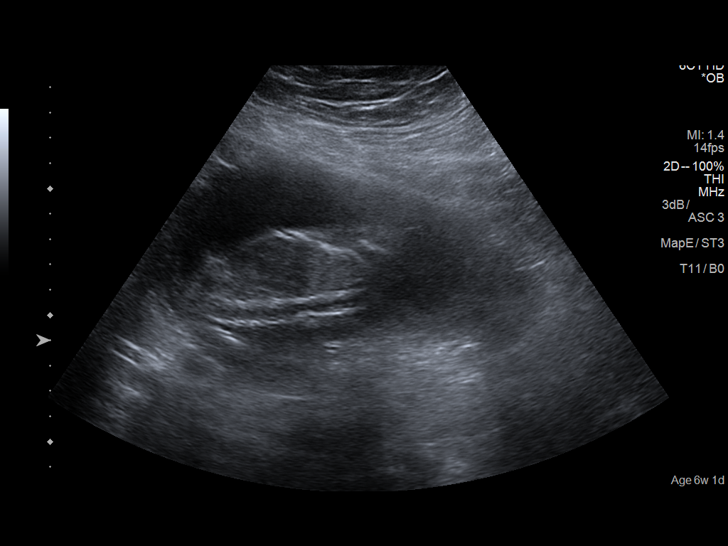
[im 5/23]
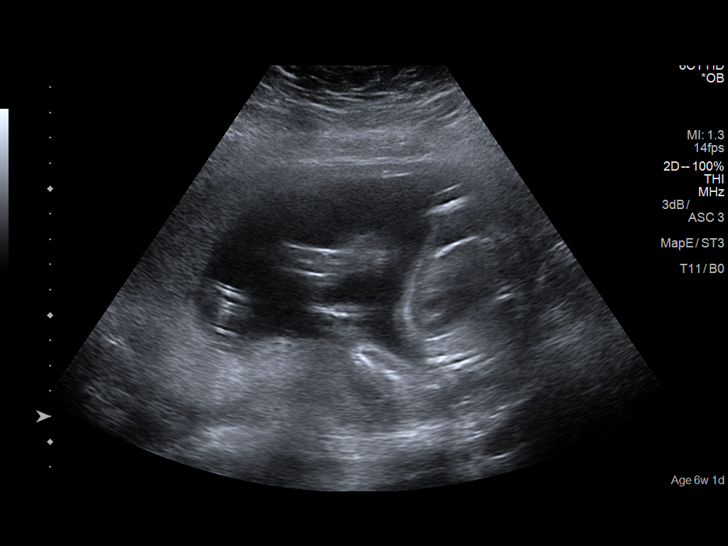
[im 6/23]
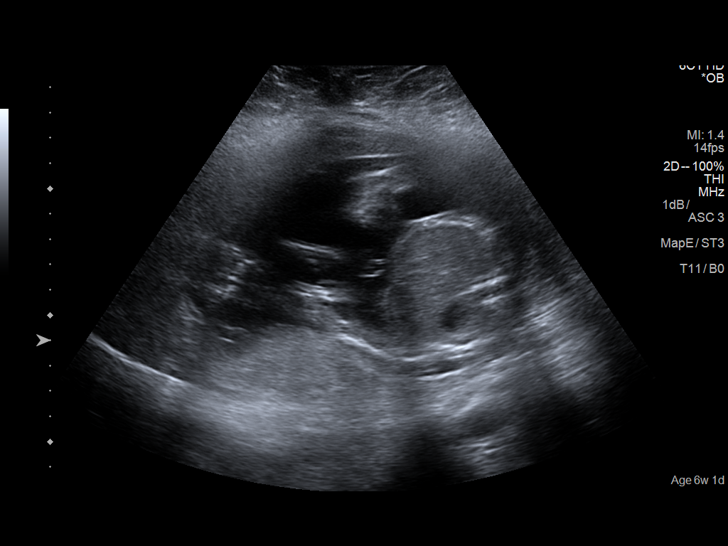
[im 8/23]
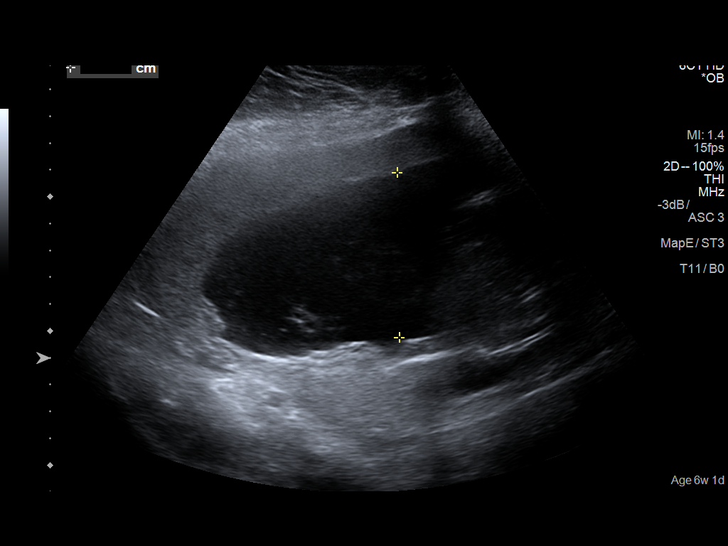
[im 10/23]
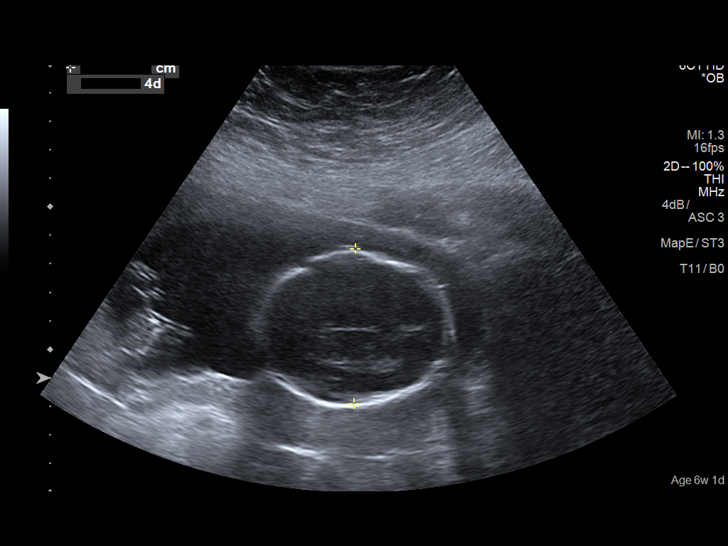
[im 11/23]
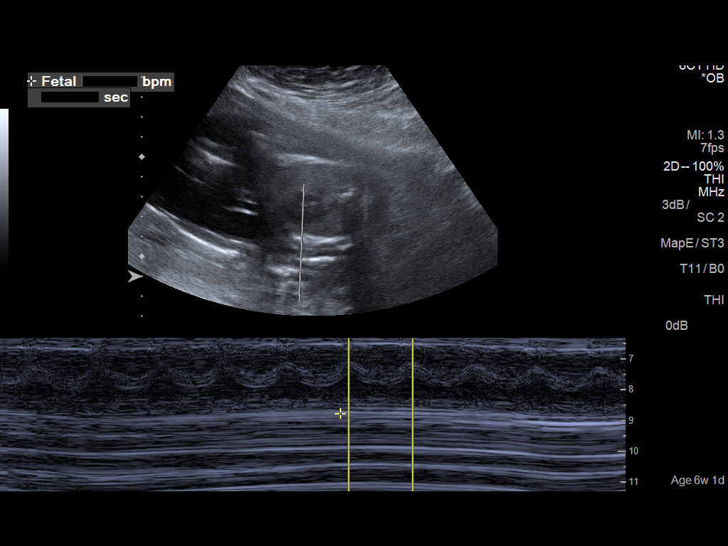
[im 13/23]
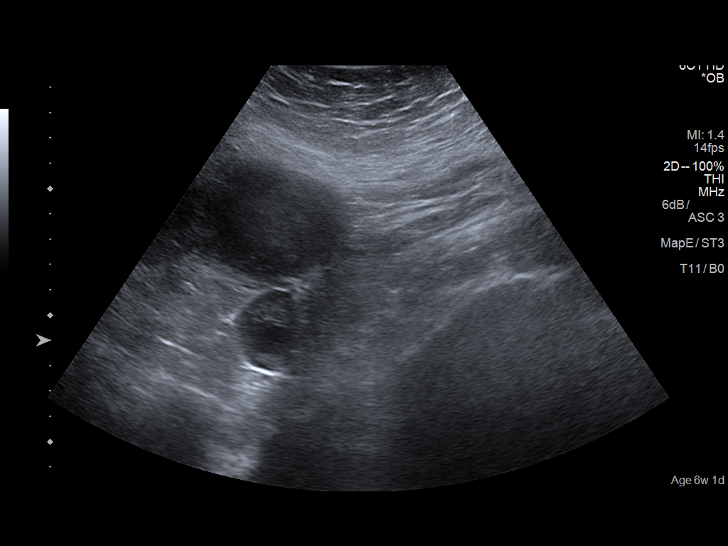
[im 14/23]
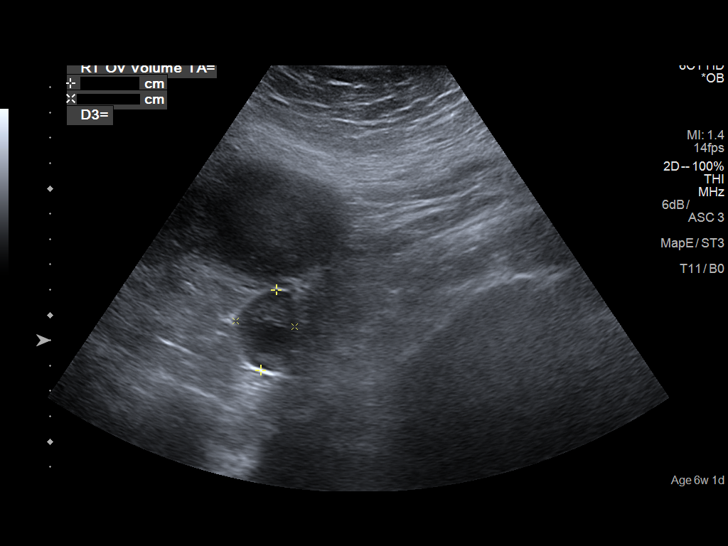
[im 16/23]
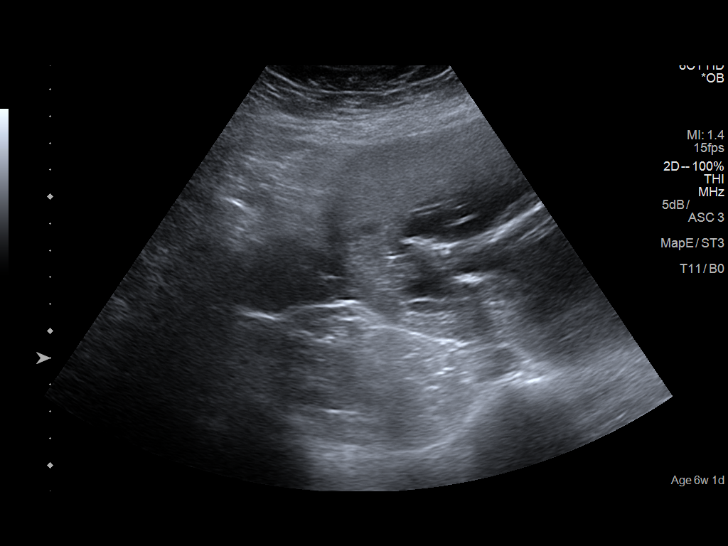
[im 18/23]
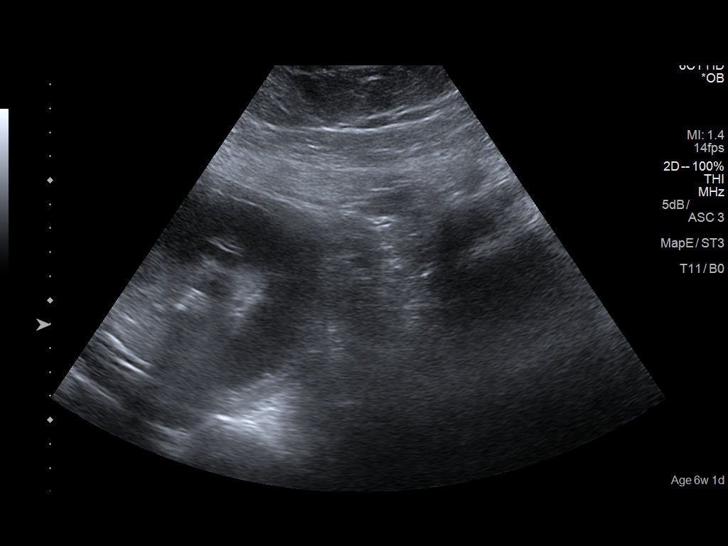
[im 19/23]
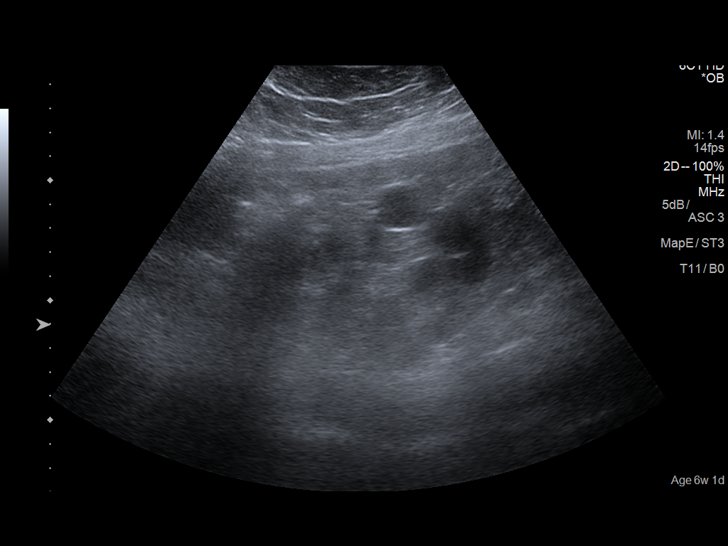
[im 21/23]
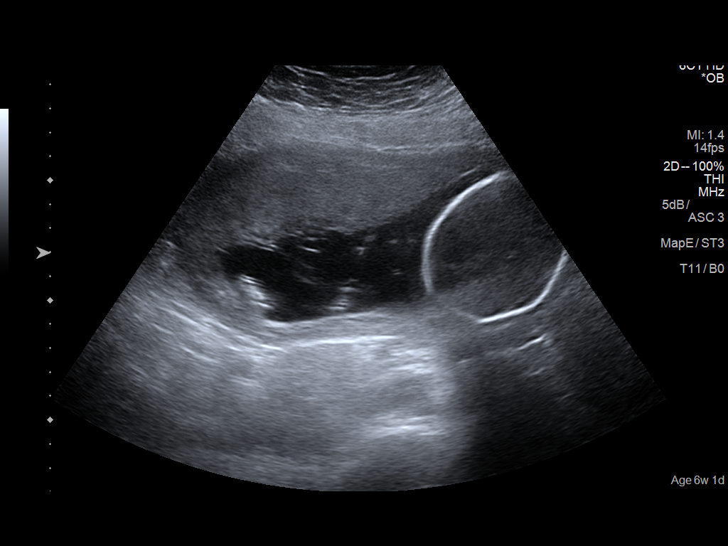
[im 23/23]
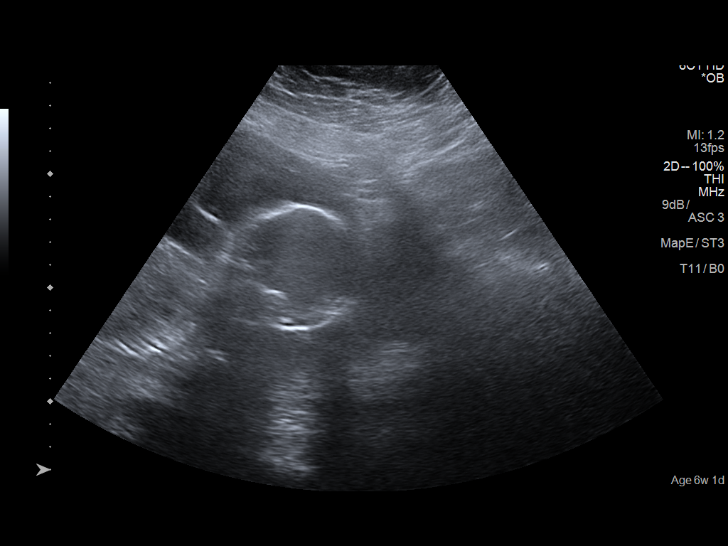

[14 of 23 positions shown; findings below may reference images not displayed]

FINDINGS: Number of Fetuses: 1

Heart Rate:  160 bpm

Movement: Yes

Presentation: Cephalic

Placental Location: Anterior

Previa: No

Amniotic Fluid (Subjective):  Within normal limits.

BPD: 5.4  cm 22 w  4 d

MATERNAL FINDINGS:

Cervix:  Not visualized

Uterus/Adnexae: Right ovary was unremarkable. The left ovary was not
visualized.
IMPRESSION: Viable 22 week 4 day gestation with subjectively normal amniotic
fluid volume. No placental abruption or previa.

This exam is performed on an emergent basis and does not
comprehensively evaluate fetal size, dating, or anatomy; follow-up
complete OB US should be considered if further fetal assessment is
warranted.

## 2020-03-13 ENCOUNTER — Encounter: Payer: Self-pay | Admitting: Emergency Medicine

## 2020-03-13 ENCOUNTER — Other Ambulatory Visit: Payer: Self-pay

## 2020-03-13 ENCOUNTER — Emergency Department
Admission: EM | Admit: 2020-03-13 | Discharge: 2020-03-13 | Disposition: A | Discharge status: Home / Self Care | Payer: Medicaid Other | Attending: Emergency Medicine | Admitting: Emergency Medicine

## 2020-03-13 DIAGNOSIS — F419 Anxiety disorder, unspecified: Secondary | ICD-10-CM | POA: Insufficient documentation

## 2020-03-13 DIAGNOSIS — F17210 Nicotine dependence, cigarettes, uncomplicated: Secondary | ICD-10-CM | POA: Diagnosis not present

## 2020-03-13 DIAGNOSIS — E119 Type 2 diabetes mellitus without complications: Secondary | ICD-10-CM | POA: Insufficient documentation

## 2020-03-13 HISTORY — DX: Type 2 diabetes mellitus without complications: E11.9

## 2020-03-13 HISTORY — DX: Anxiety disorder, unspecified: F41.9

## 2020-03-13 HISTORY — DX: Panic disorder (episodic paroxysmal anxiety): F41.0

## 2020-03-13 LAB — COMPREHENSIVE METABOLIC PANEL
ALT: 24 U/L (ref 0–44)
AST: 18 U/L (ref 15–41)
Albumin: 3.7 g/dL (ref 3.5–5.0)
Alkaline Phosphatase: 104 U/L (ref 38–126)
Anion gap: 12 (ref 5–15)
BUN: 12 mg/dL (ref 6–20)
CO2: 25 mmol/L (ref 22–32)
Calcium: 9.4 mg/dL (ref 8.9–10.3)
Chloride: 104 mmol/L (ref 98–111)
Creatinine, Ser: 0.55 mg/dL (ref 0.44–1.00)
GFR, Estimated: >60 mL/min (ref >60)
Glucose, Bld: 120 mg/dL — ABNORMAL HIGH (ref 70–99)
Potassium: 3.7 mmol/L (ref 3.5–5.1)
Sodium: 141 mmol/L (ref 135–145)
Total Bilirubin: 1.1 mg/dL (ref 0.3–1.2)
Total Protein: 7.6 g/dL (ref 6.5–8.1)

## 2020-03-13 LAB — CBC WITH DIFFERENTIAL/PLATELET
Abs Immature Granulocytes: 0.03 10*3/uL (ref 0.00–0.07)
Basophils Absolute: 0.1 10*3/uL (ref 0.0–0.1)
Basophils Relative: 1 %
Eosinophils Absolute: 0.2 10*3/uL (ref 0.0–0.5)
Eosinophils Relative: 1 %
HCT: 49.4 % — ABNORMAL HIGH (ref 36.0–46.0)
Hemoglobin: 16.9 g/dL — ABNORMAL HIGH (ref 12.0–15.0)
Immature Granulocytes: 0 %
Lymphocytes Relative: 41 %
Lymphs Abs: 5.8 10*3/uL — ABNORMAL HIGH (ref 0.7–4.0)
MCH: 30.3 pg (ref 26.0–34.0)
MCHC: 34.2 g/dL (ref 30.0–36.0)
MCV: 88.5 fL (ref 80.0–100.0)
Monocytes Absolute: 0.8 10*3/uL (ref 0.1–1.0)
Monocytes Relative: 6 %
Neutro Abs: 7.3 10*3/uL (ref 1.7–7.7)
Neutrophils Relative %: 51 %
Platelets: 298 10*3/uL (ref 150–400)
RBC: 5.58 MIL/uL — ABNORMAL HIGH (ref 3.87–5.11)
RDW: 12.7 % (ref 11.5–15.5)
WBC: 14.2 10*3/uL — ABNORMAL HIGH (ref 4.0–10.5)
nRBC: 0 % (ref 0.0–0.2)

## 2020-03-13 LAB — CBG MONITORING, ED: Glucose-Capillary: 111 mg/dL — ABNORMAL HIGH (ref 70–99)

## 2020-03-13 LAB — TSH: TSH: 2.485 u[IU]/mL (ref 0.350–4.500)

## 2020-03-13 MED ORDER — LORAZEPAM 1 MG PO TABS: 1.0000 mg | ORAL_TABLET | Freq: Two times a day (BID) | ORAL | 0 refills | Status: AC

## 2020-03-13 MED ORDER — LORAZEPAM 1 MG PO TABS
1.0000 mg | ORAL_TABLET | Freq: Once | ORAL | Status: AC
  Administered 2020-03-13: 1 mg via ORAL
  Filled 2020-03-13: qty 1

## 2020-03-13 NOTE — ED Notes (Signed)
Pt reports anxiety with no known cause or change in situation to stem anxiety. Pt reports history of med use for anxiety but none in last 1-2 years. Pt is tearful in bed and has some tremor she reports occurs with anxiety. As pt speaks to staff she becomes more tearful. Pt states this is not new but has not occurred for some time.

## 2020-03-13 NOTE — BH Assessment (Signed)
Assessment Note  Sheila Mcknight is an 34 y.o. female presenting to [facility] ED voluntarily for Anxiety. Per triage note Pt to ED from home c/o anxiety today.  States woke up around 0630 to a panic attack and had another one around 1430.  Pt had a third panic attack around 1700 and decided to go to [facility].  States anxiety was not going away so [facility] sent patient here.  Pt has appointment with psychiatrist in 6 days.  Pt denies SI/HI, no visual or auditory hallucinations, states just overwhelmed and unsure why. During assessment patient appears alert and oriented x4, cooperative, tearful and anxious. Patient reported "I have sever anxiety, [facility] sent me here because I couldn't calm down." "I had a panic attack this morning, after that I was okay all day then I had another one this evening." Patient reports that nothing in particular causes her panic attacks. Patient reports that she has numerous family members that have anxiety. "I was medicated for anxiety for years and the doctor weaned me off my medication a year or two ago because I was doing so good." Patient reports experiencing anxiety since the age of [age]. Patient reports that she is now currently engaged with [facility] and has a psychiatrist. She reports her last appointment was in October and has a upcoming appointment on December 21st. Clinician discussed with patient if she has a therapist along with her psychiatrist "I don't but I open to it." Patient denies SI/HI/AH/VH and does not appear to be responding to any internal or external stimuli.  Per Psyc NP Nira Conn patient does not meet criteria for Inpatient Hospitalization, patient to follow-up with [facility] provider  Diagnosis: Anxiety  Past Medical History:  Past Medical History:  Diagnosis Date  . Anxiety   . Diabetes mellitus without complication (HCC)   . Panic attacks     Past Surgical History:  Procedure Laterality Date  . removal of fallopian tubes    . TUBAL LIGATION      Family History:  History reviewed. No pertinent family history.  Social History:  reports that she has been smoking. She has been smoking about 0.50 packs per day. She has never used smokeless tobacco. She reports that she does not drink alcohol and does not use drugs.  Additional Social History:  Alcohol / Drug Use Pain Medications: See MAR Prescriptions: See MAR Over the Counter: See MAR History of alcohol / drug use?: No history of alcohol / drug abuse  CIWA: CIWA-Ar BP: (!) 145/85 Pulse Rate: 90 COWS:    Allergies:  Allergies  Allergen Reactions  . Contrast Media [Iodinated Diagnostic Agents] Anaphylaxis  . Penicillins Anaphylaxis  . Latex Rash    Home Medications: (Not in a hospital admission)   OB/GYN Status:  Patient's last menstrual period was 02/21/2020 (exact date).  General Assessment Data Location of Assessment: [facility] TTS Assessment: In system Is this a Tele or Face-to-Face Assessment?: Face-to-Face Is this an Initial Assessment or a Re-assessment for this encounter?: Initial Assessment Patient Accompanied by:: N/A Language Other than English: No Living Arrangements: Other (Comment) What gender do you identify as?: Female Marital status: Single Pregnancy Status: No Living Arrangements: Other (Comment) (Private Residence) Can pt return to current living arrangement?: Yes Admission Status: Voluntary Is patient capable of signing voluntary admission?: Yes Referral Source: Other Insurance type: Medicaid  Medical Screening Exam [facility] Walk-in ONLY) Medical Exam completed: Yes  Crisis Care Plan Living Arrangements: Other (Comment) (Private Residence) Legal Guardian: Other: (Self) Name  of Psychiatrist: [facility] Name of Therapist: [facility]  Education Status Is patient currently in school?: No Is the patient employed, unemployed or receiving disability?: Unemployed  Risk to self with the past 6 months Suicidal Ideation: No Has patient been a risk to self within the past 6 months  prior to admission? : No Suicidal Intent: No Has patient had any suicidal intent within the past 6 months prior to admission? : No Is patient at risk for suicide?: No Suicidal Plan?: No Has patient had any suicidal plan within the past 6 months prior to admission? : No Access to Means: No What has been your use of drugs/alcohol within the last 12 months?: None Previous Attempts/Gestures: No How many times?: 0 Other Self Harm Risks: None Triggers for Past Attempts: None known Intentional Self Injurious Behavior: None Family Suicide History: No Recent stressful life event(s): Other (Comment) (None reported) Persecutory voices/beliefs?: No Depression: No Substance abuse history and/or treatment for substance abuse?: No Suicide prevention information given to non-admitted patients: Not applicable  Risk to Others within the past 6 months Homicidal Ideation: No Does patient have any lifetime risk of violence toward others beyond the six months prior to admission? : No Thoughts of Harm to Others: No Current Homicidal Intent: No Current Homicidal Plan: No Access to Homicidal Means: No Identified Victim: None History of harm to others?: No Assessment of Violence: None Noted Violent Behavior Description: None Does patient have access to weapons?: No Criminal Charges Pending?: No Does patient have a court date: No Is patient on probation?: No  Psychosis Hallucinations: None noted Delusions: None noted  Mental Status Report Appearance/Hygiene: In scrubs,Unremarkable Eye Contact: Good Motor Activity: Freedom of movement Speech: Logical/coherent Level of Consciousness: Alert Mood: Pleasant Affect: Appropriate to circumstance Anxiety Level: Severe Thought Processes: Coherent Judgement: Unimpaired Orientation: Person,Place,Time,Situation,Appropriate for developmental age Obsessive Compulsive Thoughts/Behaviors: None  Cognitive Functioning Concentration: Normal Memory: Recent  Intact,Remote Intact Is patient IDD: No Insight: Good Impulse Control: Good Appetite: Good Have you had any weight changes? : No Change Sleep: No Change Total Hours of Sleep: 6 Vegetative Symptoms: None  ADLScreening [facility] Assessment Services) Patient's cognitive ability adequate to safely complete daily activities?: Yes Patient able to express need for assistance with ADLs?: Yes Independently performs ADLs?: Yes (appropriate for developmental age)  Prior Inpatient Therapy Prior Inpatient Therapy: Yes Prior Therapy Dates: 2000 Prior Therapy Facilty/Provider(s): Unknown Reason for Treatment: Anxiety  Prior Outpatient Therapy Prior Outpatient Therapy: Yes Prior Therapy Dates: Currently Prior Therapy Facilty/Provider(s): [facility] Reason for Treatment: Anxiety Does patient have an ACCT team?: No Does patient have Intensive In-House Services?  : No Does patient have [facility] services? : No Does patient have P4CC services?: No  ADL Screening (condition at time of admission) Patient's cognitive ability adequate to safely complete daily activities?: Yes Is the patient deaf or have difficulty hearing?: No Does the patient have difficulty seeing, even when wearing glasses/contacts?: No Does the patient have difficulty concentrating, remembering, or making decisions?: No Patient able to express need for assistance with ADLs?: Yes Does the patient have difficulty dressing or bathing?: No Independently performs ADLs?: Yes (appropriate for developmental age) Does the patient have difficulty walking or climbing stairs?: No Weakness of Legs: None Weakness of Arms/Hands: None  Home Assistive Devices/Equipment Home Assistive Devices/Equipment: None  Therapy Consults (therapy consults require a physician order) PT Evaluation Needed: No OT Evalulation Needed: No SLP Evaluation Needed: No Abuse/Neglect Assessment (Assessment to be complete while patient is alone) Abuse/Neglect Assessment Can  Be Completed:  Yes Physical Abuse: Denies Verbal Abuse: Denies Sexual Abuse: Denies Exploitation of patient/patient's resources: Denies Self-Neglect: Denies Values / Beliefs Cultural Requests During Hospitalization: None Spiritual Requests During Hospitalization: None Consults Spiritual Care Consult Needed: No Transition of Care Team Consult Needed: No Advance Directives (For Healthcare) Does Patient Have a Medical Advance Directive?: No Would patient like information on creating a medical advance directive?: No - Patient declined          Disposition: Per Psyc NP Nira Conn patient does not meet criteria for Inpatient Hospitalization, patient to follow-up with [facility] provider Disposition Initial Assessment Completed for this Encounter: Yes  On Site Evaluation by:   Reviewed with Physician:    Benay Pike MS LCASA 03/13/2020 9:26 PM

## 2020-03-13 NOTE — ED Notes (Signed)
Hourly rounding completed at this time, patient currently awake in hallway bed. No complaints, stable, and in no acute distress. Q15 minute rounds and monitoring via Rover and Officer to continue. 

## 2020-03-13 NOTE — ED Notes (Signed)
Patient transferred from Triage to room 19H after dressing out and screening for contraband. Report received from Jeannett Senior, RN including situation, background, assessment and recommendations. Pt oriented to [employer] including Q15 minute rounds as well as Psychologist, counselling for their protection. Patient is alert and oriented, warm and dry in no acute distress. Patient denies SI, HI, and AVH. Pt. Encouraged to let this nurse know if needs arise.

## 2020-03-13 NOTE — ED Provider Notes (Signed)
[facility] Emergency Department Provider Note   ____________________________________________   Event Date/Time   First MD Initiated Contact with Patient 03/13/20 2038     (approximate)  I have reviewed the triage vital signs and the nursing notes.   HISTORY  Chief Complaint Anxiety    HPI Sheila Mcknight is a 34 y.o. female who comes in for anxiety.  Patient reports she had anxiety attacks and panic attacks for many years took Valium as a teenager that was prescribed and then was switched over to Klonopin and later Xanax.  She had a therapist several years ago that taught her relaxation techniques and was able to get her off the Xanax and she was doing well for several years.  Lately she started having these attacks of impending doom and dread just like she was before.  She has no reason for chest no problems with her relationship with her children everything is going well but she will wake up with these feelings out of sleep or develop them for no reason during the day.  She has had 3 prior to going to [facility] today [facility] center here and she has had several sitting on the stretcher waiting for me.  She has no other past history besides anxiety and panic attacks except for diabetes.  She believes that that has been under good control.        Past Medical History:  Diagnosis Date   Anxiety    Diabetes mellitus without complication (HCC)    Panic attacks     There are no problems to display for this patient.   Past Surgical History:  Procedure Laterality Date   removal of fallopian tubes     TUBAL LIGATION      Prior to Admission medications   Medication Sig Start Date End Date Taking? Authorizing Provider  predniSONE (DELTASONE) 20 MG tablet Take 2 tablets by mouth, once daily, for 5 days 10/06/15   Hagler, Jami L, PA-C  sulfamethoxazole-trimethoprim (BACTRIM DS,SEPTRA DS) 800-160 MG tablet Take 1 tablet by mouth 2 (two) times daily. 10/06/15    Hagler, Jami L, PA-C    Allergies Contrast media [iodinated diagnostic agents], Penicillins, and Latex  History reviewed. No pertinent family history.  Social History Social History   Tobacco Use   Smoking status: Current Every Day Smoker    Packs/day: 0.50   Smokeless tobacco: Never Used  Substance Use Topics   Alcohol use: No   Drug use: No    Review of Systems  Constitutional: No fever/chills Eyes: No visual changes. ENT: No sore throat. Cardiovascular: Denies chest pain. Respiratory: Denies shortness of breath. Gastrointestinal: No abdominal pain.  No nausea, no vomiting.  No diarrhea.  No constipation. Genitourinary: Negative for dysuria. Musculoskeletal: Negative for back pain. Skin: Negative for rash. Neurological: Negative for headaches, focal weakness  ____________________________________________   PHYSICAL EXAM:  VITAL SIGNS: ED Triage Vitals  Enc Vitals Group     BP 03/13/20 1954 (!) 145/85     Pulse Rate 03/13/20 1954 90     Resp 03/13/20 1954 16     Temp 03/13/20 1954 99.1 F (37.3 C)     Temp Source 03/13/20 1954 Oral     SpO2 03/13/20 1954 99 %     Weight 03/13/20 1954 220 lb (99.8 kg)     Height 03/13/20 1954 5\' 8"  (1.727 m)     Head Circumference --      Peak Flow --  Pain Score 03/13/20 2000 0     Pain Loc --      Pain Edu? --      Excl. in GC? --     Constitutional: Alert and oriented. Well appearing and in no acute distress. Eyes: Conjunctivae are normal.  Head: Atraumatic. Nose: No congestion/rhinnorhea. Mouth/Throat: Mucous membranes are moist.  Oropharynx non-erythematous. Neck: No stridor.  Cardiovascular: Normal rate, regular rhythm. Grossly normal heart sounds.  Good peripheral circulation. Respiratory: Normal respiratory effort.  No retractions. Lungs CTAB. Gastrointestinal: Soft and nontender. No distention. No abdominal bruits. No CVA tenderness. Musculoskeletal: No lower extremity tenderness nor edema.    Neurologic:  Normal speech and language. No gross focal neurologic deficits are appreciated. No gait instability. Skin:  Skin is warm, dry and intact. No rash noted.   ____________________________________________   LABS (all labs ordered are listed, but only abnormal results are displayed)  Labs Reviewed  CBC WITH DIFFERENTIAL/PLATELET - Abnormal; Notable for the following components:      Result Value   WBC 14.2 (*)    RBC 5.58 (*)    Hemoglobin 16.9 (*)    HCT 49.4 (*)    Lymphs Abs 5.8 (*)    All other components within normal limits  COMPREHENSIVE METABOLIC PANEL - Abnormal; Notable for the following components:   Glucose, Bld 120 (*)    All other components within normal limits  CBG MONITORING, ED - Abnormal; Notable for the following components:   Glucose-Capillary 111 (*)    All other components within normal limits  TSH   ____________________________________________  EKG EKG read interpreted by me shows normal sinus rhythm rate of 93 normal axis the computer is reading rightward axis of 93 is with very close no acute ST-T wave changes EKG looks very similar to the one from April 2019 ____________________________________________  RADIOLOGY Jill Poling, personally viewed and evaluated these images (plain radiographs) as part of my medical decision making, as well as reviewing the written report by the radiologist.  ED MD interpretation  Official radiology report(s): No results found.  ____________________________________________   PROCEDURES  Procedure(s) performed (including Critical Care):  Procedures   ____________________________________________   INITIAL IMPRESSION / ASSESSMENT AND PLAN / ED COURSE      Patient with history most consistent with anxiety.  She feels like she has before.  I will try a little bit of Ativan and have her follow-up with [facility].  Patient is not homicidal or suicidal.          ____________________________________________   FINAL CLINICAL IMPRESSION(S) / ED DIAGNOSES  Final diagnoses:  Anxiety     ED Discharge Orders    None      *Please note:  LERLENE TREADWELL was evaluated in Emergency Department on 03/13/2020 for the symptoms described in the history of present illness. She was evaluated in the context of the global COVID-19 pandemic, which necessitated consideration that the patient might be at risk for infection with the SARS-CoV-2 virus that causes COVID-19. Institutional protocols and algorithms that pertain to the evaluation of patients at risk for COVID-19 are in a state of rapid change based on information released by regulatory bodies including the CDC and federal and state organizations. These policies and algorithms were followed during the patient's care in the ED.  Some ED evaluations and interventions may be delayed as a result of limited staffing during and the pandemic.*   Note:  This document was prepared using Conservation officer, historic buildings and  may include unintentional dictation errors.    Arnaldo Natal, MD 03/13/20 2259

## 2020-03-13 NOTE — ED Triage Notes (Signed)
Pt to ED from home c/o anxiety today.  States woke up around 0630 to a panic attack and had another one around 1430.  Pt had a third panic attack around 1700 and decided to go to [facility].  States anxiety was not going away so [facility] sent patient here.  Pt has appointment with psychiatrist in 6 days.  Pt denies SI/HI, no visual or auditory hallucinations, states just overwhelmed and unsure why.

## 2020-03-13 NOTE — Discharge Instructions (Signed)
Please follow-up with [facility].  Please do not hesitate to return if you are feeling worse.  I have given you just a little bit of Ativan 1 pill twice a day.  That should help.

## 2020-04-12 ENCOUNTER — Emergency Department
Admission: EM | Admit: 2020-04-12 | Discharge: 2020-04-12 | Disposition: A | Discharge status: Home / Self Care | Payer: Medicaid Other | Attending: Emergency Medicine | Admitting: Emergency Medicine

## 2020-04-12 ENCOUNTER — Emergency Department: Payer: Medicaid Other

## 2020-04-12 ENCOUNTER — Other Ambulatory Visit: Payer: Self-pay

## 2020-04-12 DIAGNOSIS — F17200 Nicotine dependence, unspecified, uncomplicated: Secondary | ICD-10-CM | POA: Diagnosis not present

## 2020-04-12 DIAGNOSIS — Z91040 Latex allergy status: Secondary | ICD-10-CM | POA: Insufficient documentation

## 2020-04-12 DIAGNOSIS — Y9389 Activity, other specified: Secondary | ICD-10-CM | POA: Insufficient documentation

## 2020-04-12 DIAGNOSIS — E119 Type 2 diabetes mellitus without complications: Secondary | ICD-10-CM | POA: Insufficient documentation

## 2020-04-12 DIAGNOSIS — W010XXA Fall on same level from slipping, tripping and stumbling without subsequent striking against object, initial encounter: Secondary | ICD-10-CM | POA: Diagnosis not present

## 2020-04-12 DIAGNOSIS — S20212A Contusion of left front wall of thorax, initial encounter: Secondary | ICD-10-CM | POA: Diagnosis present

## 2020-04-12 MED ORDER — LIDOCAINE 5 % EX PTCH: 1.0000 | MEDICATED_PATCH | Freq: Two times a day (BID) | CUTANEOUS | 0 refills | Status: AC

## 2020-04-12 MED ORDER — IBUPROFEN 600 MG PO TABS: 600.0000 mg | ORAL_TABLET | Freq: Three times a day (TID) | ORAL | 0 refills | Status: AC | PRN

## 2020-04-12 MED ORDER — IPRATROPIUM-ALBUTEROL 0.5-2.5 (3) MG/3ML IN SOLN
RESPIRATORY_TRACT | Status: AC
  Filled 2020-04-12: qty 3

## 2020-04-12 MED ORDER — IBUPROFEN 600 MG PO TABS
600.0000 mg | ORAL_TABLET | Freq: Once | ORAL | Status: AC
  Administered 2020-04-12: 600 mg via ORAL
  Filled 2020-04-12: qty 1

## 2020-04-12 MED ORDER — OXYCODONE-ACETAMINOPHEN 5-325 MG PO TABS
1.0000 | ORAL_TABLET | Freq: Once | ORAL | Status: AC
  Administered 2020-04-12: 1 via ORAL
  Filled 2020-04-12: qty 1

## 2020-04-12 MED ORDER — LIDOCAINE 5 % EX PTCH
1.0000 | MEDICATED_PATCH | CUTANEOUS | Status: DC
  Administered 2020-04-12: 1 via TRANSDERMAL
  Filled 2020-04-12: qty 1

## 2020-04-12 MED ORDER — OXYCODONE-ACETAMINOPHEN 7.5-325 MG PO TABS: 1.0000 | ORAL_TABLET | Freq: Four times a day (QID) | ORAL | 0 refills | Status: AC | PRN

## 2020-04-12 NOTE — Discharge Instructions (Signed)
Follow discharge care instruction take medication as directed. °

## 2020-04-12 NOTE — ED Notes (Signed)
Ambulatory from [facility].  Reports yesterday a 300lb person fell on her left chest/side and now has rib pain worse with a deep breath.  Rates pain 7/10 and worse with movement.  Xray completed.

## 2020-04-12 NOTE — ED Triage Notes (Signed)
Pt was playing with son and husband tripped landing on her left chest. Has been having left rib pain, hurts to lift left arm or take a deep breath.

## 2020-04-12 NOTE — ED Provider Notes (Signed)
[facility] Emergency Department Provider Note   ____________________________________________   Event Date/Time   First MD Initiated Contact with Patient 04/12/20 [phone]     (approximate)  I have reviewed the triage vital signs and the nursing notes.   HISTORY  Chief Complaint Rib Injury    HPI Sheila Mcknight is a 35 y.o. female patient complain of left lateral chest wall pain secondary to contusion.  Patient states she was playing with son and husband when the hospital and tripped landing on her left chest.  Patient stated pain increased with deep inspirations.  Patient rates pain as 7/10.  Patient describes the pain as "aching".  No palliative measure for complaint.         Past Medical History:  Diagnosis Date  . Anxiety   . Diabetes mellitus without complication (HCC)   . Panic attacks     There are no problems to display for this patient.   Past Surgical History:  Procedure Laterality Date  . removal of fallopian tubes    . TUBAL LIGATION      Prior to Admission medications   Medication Sig Start Date End Date Taking? Authorizing Provider  ibuprofen (ADVIL) 600 MG tablet Take 1 tablet (600 mg total) by mouth every 8 (eight) hours as needed. 04/12/20  Yes Joni Reining, PA-C  lidocaine (LIDODERM) 5 % Place 1 patch onto the skin every 12 (twelve) hours. Remove & Discard patch within 12 hours or as directed by MD 04/12/20 04/12/21 Yes Joni Reining, PA-C  oxyCODONE-acetaminophen (PERCOCET) 7.5-325 MG tablet Take 1 tablet by mouth every 6 (six) hours as needed. 04/12/20  Yes Joni Reining, PA-C  LORazepam (ATIVAN) 1 MG tablet Take 1 tablet (1 mg total) by mouth 2 (two) times daily. 03/13/20 03/13/21  Arnaldo Natal, MD  predniSONE (DELTASONE) 20 MG tablet Take 2 tablets by mouth, once daily, for 5 days 10/06/15   Hagler, Jami L, PA-C  sulfamethoxazole-trimethoprim (BACTRIM DS,SEPTRA DS) 800-160 MG tablet Take 1 tablet by mouth 2 (two) times  daily. 10/06/15   Hagler, Jami L, PA-C    Allergies Contrast media [iodinated diagnostic agents], Penicillins, and Latex  No family history on file.  Social History Social History   Tobacco Use  . Smoking status: Current Every Day Smoker    Packs/day: 0.50  . Smokeless tobacco: Never Used  Substance Use Topics  . Alcohol use: No  . Drug use: No    Review of Systems Constitutional: No fever/chills Eyes: No visual changes. ENT: No sore throat. Cardiovascular: Denies chest pain. Respiratory: Denies shortness of breath. Gastrointestinal: No abdominal pain.  No nausea, no vomiting.  No diarrhea.  No constipation. Genitourinary: Negative for dysuria. Musculoskeletal: Left lateral chest wall pain. Skin: Negative for rash. Neurological: Negative for headaches, focal weakness or numbness. Psychiatric: Anxiety  Endocrine:  Diabetes   ____________________________________________   PHYSICAL EXAM:  VITAL SIGNS: ED Triage Vitals [04/12/20 0138]  Enc Vitals Group     BP 135/89     Pulse Rate 100     Resp 20     Temp 98.1 F (36.7 C)     Temp Source Oral     SpO2 96 %     Weight 210 lb (95.3 kg)     Height 5\' 8"  (1.727 m)     Head Circumference      Peak Flow      Pain Score 7     Pain Loc  Pain Edu?      Excl. in GC?    Constitutional: Alert and oriented. Well appearing and in no acute distress. Eyes: Conjunctivae are normal. PERRL. EOMI. Head: Atraumatic. Nose: No congestion/rhinnorhea. Mouth/Throat: Mucous membranes are moist.  Oropharynx non-erythematous. Neck: No stridor.  Hematological/Lymphatic/Immunilogical: No cervical lymphadenopathy. Cardiovascular: Normal rate, regular rhythm. Grossly normal heart sounds.  Good peripheral circulation. Respiratory: Normal respiratory effort.  No retractions. Lungs CTAB. Gastrointestinal: Soft and nontender. No distention. No abdominal bruits. No CVA tenderness. Genitourinary: Deferred Musculoskeletal: No obvious  deformity to left lateral chest wall.  Patient has moderate guarding at T4-T6. Neurologic:  Normal speech and language. No gross focal neurologic deficits are appreciated. No gait instability. Skin:  Skin is warm, dry and intact. No rash noted.  No abrasion, ecchymosis, or erythema. Psychiatric: Mood and affect are normal. Speech and behavior are normal.  ____________________________________________   LABS (all labs ordered are listed, but only abnormal results are displayed)  Labs Reviewed - No data to display ____________________________________________  EKG   ____________________________________________  RADIOLOGY I, Joni Reining, personally viewed and evaluated these images (plain radiographs) as part of my medical decision making, as well as reviewing the written report by the radiologist.  ED MD interpretation: No acute findings on chest x-ray or lateral ribs.  Official radiology report(s): DG Ribs Unilateral W/Chest Left  Result Date: 04/12/2020 CLINICAL DATA:  Husband fell on left chest EXAM: LEFT RIBS AND CHEST - 3+ VIEW COMPARISON:  09/24/2005 FINDINGS: No fracture or other bone lesions are seen involving the ribs. There is no evidence of pneumothorax or pleural effusion. Both lungs are clear. Heart size and mediastinal contours are within normal limits. IMPRESSION: Negative. Electronically Signed   By: Charlett Nose M.D.   On: 04/12/2020 02:22    ____________________________________________   PROCEDURES  Procedure(s) performed (including Critical Care):  Procedures   ____________________________________________   INITIAL IMPRESSION / ASSESSMENT AND PLAN / ED COURSE  As part of my medical decision making, I reviewed the following data within the electronic MEDICAL RECORD NUMBER         Patient presents with lateral chest wall pain secondary to contusion.  Discussed no acute findings on x-ray.  Patient complaint physical exam is consistent with chest wall  contusion.  Patient given discharge care instruction and a work note.  Take medication as directed.  Patient advised on drug effects of medication.  Patient advised [facility].      ____________________________________________   FINAL CLINICAL IMPRESSION(S) / ED DIAGNOSES  Final diagnoses:  Contusion of rib, left, initial encounter     ED Discharge Orders         Ordered    oxyCODONE-acetaminophen (PERCOCET) 7.5-325 MG tablet  Every 6 hours PRN        04/12/20 0731    ibuprofen (ADVIL) 600 MG tablet  Every 8 hours PRN        04/12/20 0731    lidocaine (LIDODERM) 5 %  Every 12 hours        04/12/20 0731          *Please note:  Sheila Mcknight was evaluated in Emergency Department on 04/12/2020 for the symptoms described in the history of present illness. She was evaluated in the context of the global COVID-19 pandemic, which necessitated consideration that the patient might be at risk for infection with the SARS-CoV-2 virus that causes COVID-19. Institutional protocols and algorithms that pertain to the evaluation of patients at risk for  COVID-19 are in a state of rapid change based on information released by regulatory bodies including the CDC and federal and state organizations. These policies and algorithms were followed during the patient's care in the ED.  Some ED evaluations and interventions may be delayed as a result of limited staffing during and the pandemic.*   Note:  This document was prepared using Dragon voice recognition software and may include unintentional dictation errors.    Joni Reining, PA-C 04/12/20 [id number]    Jene Every, MD 04/12/20 0900

## 2021-09-10 IMAGING — CR DG RIBS W/ CHEST 3+V*L*
1 series · 3 of 3 positions shown · non-contrast
Comparison: 09/24/2005

CLINICAL DATA: Husband fell on left chest

EXAM:
LEFT RIBS AND CHEST - 3+ VIEW

[Series 1: dg ribs unilateral w/chest left · 0.14mm/px · 3 of 3 slices shown]
[im 1/3]
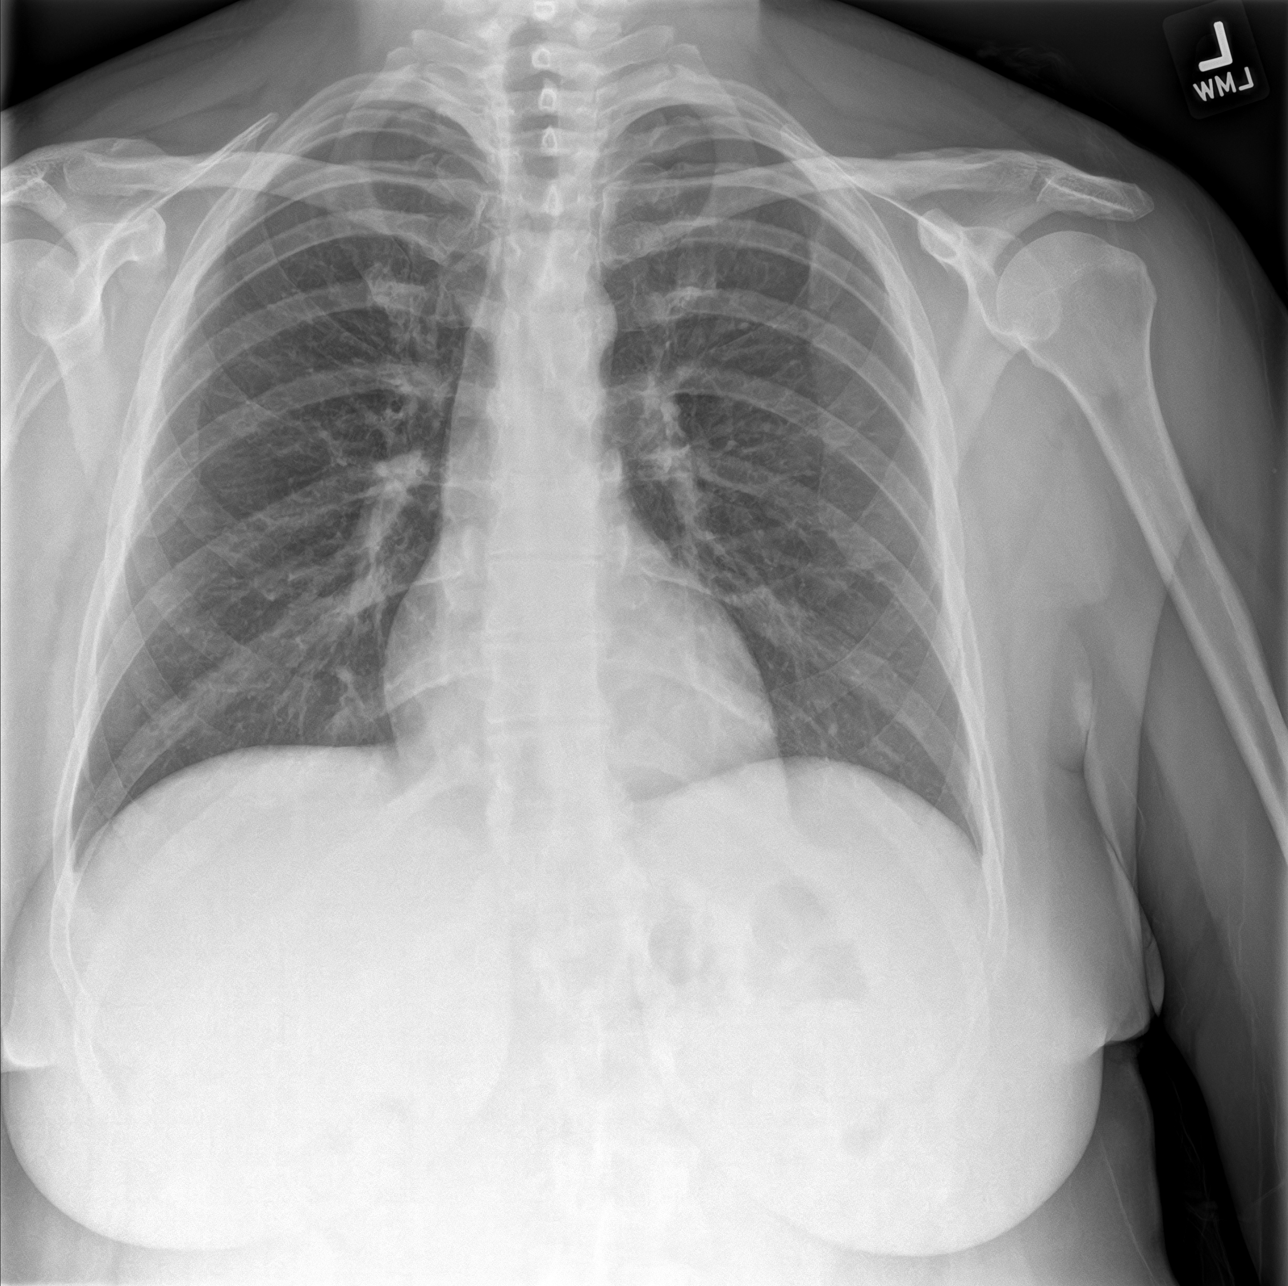
[im 2/3]
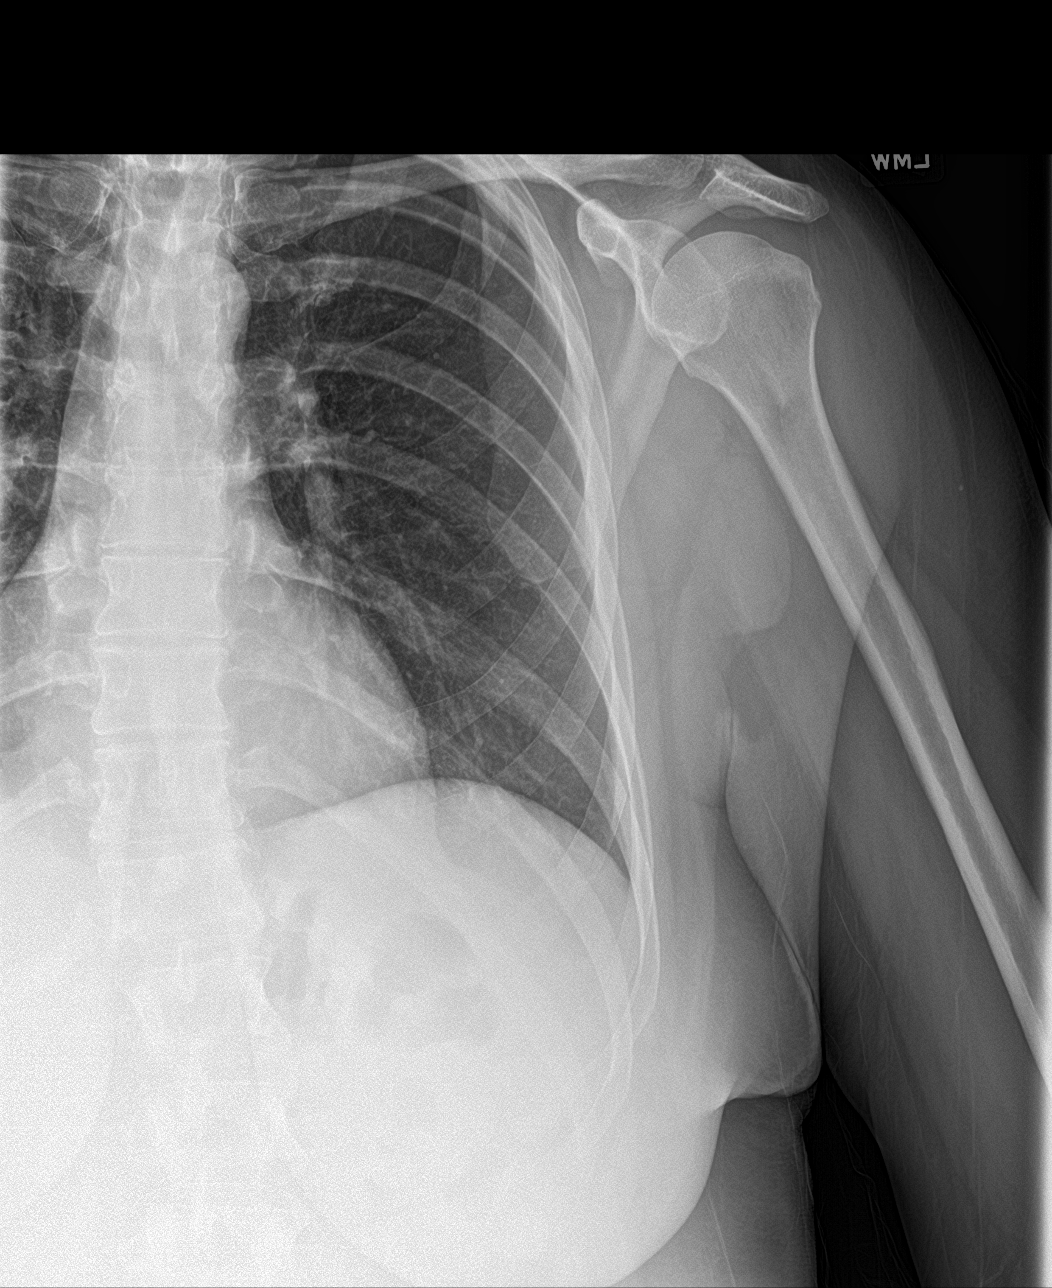
[im 3/3]
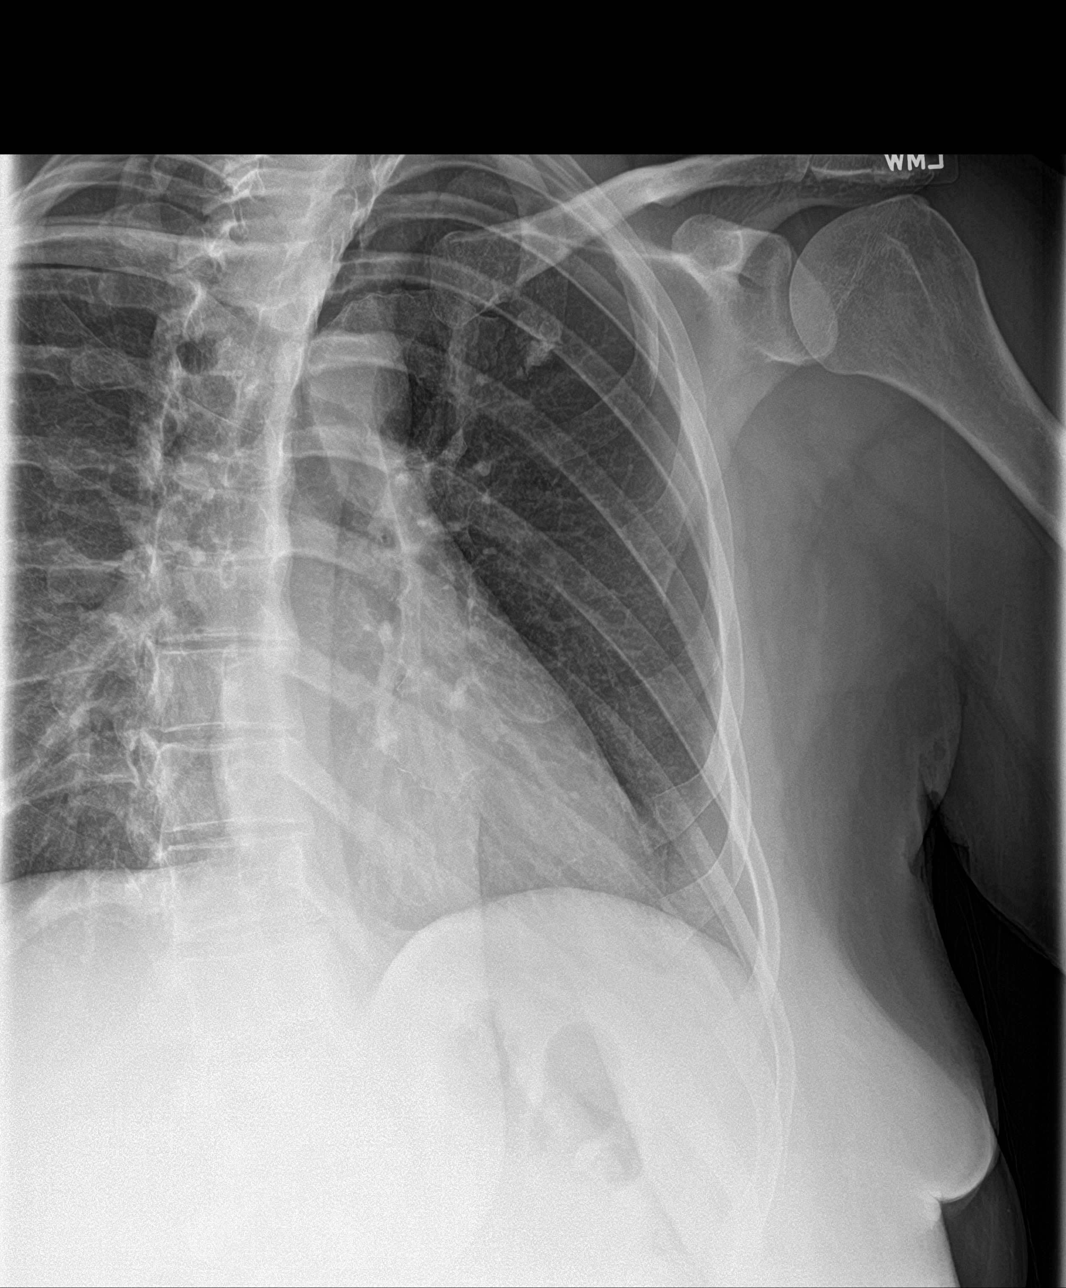

[3 of 3 positions shown; findings below may reference images not displayed]

FINDINGS: No fracture or other bone lesions are seen involving the ribs. There
is no evidence of pneumothorax or pleural effusion. Both lungs are
clear. Heart size and mediastinal contours are within normal limits.
IMPRESSION: Negative.
